# Patient Record
Sex: Female | Born: 1975
Health system: Southern US, Community
[De-identification: ages and names within clinical notes are randomized; demographics above are authoritative.]

## PROBLEM LIST (undated history)

## (undated) DIAGNOSIS — Z973 Presence of spectacles and contact lenses: Secondary | ICD-10-CM

## (undated) DIAGNOSIS — T7840XA Allergy, unspecified, initial encounter: Secondary | ICD-10-CM

## (undated) DIAGNOSIS — D649 Anemia, unspecified: Secondary | ICD-10-CM

## (undated) DIAGNOSIS — I10 Essential (primary) hypertension: Secondary | ICD-10-CM

## (undated) DIAGNOSIS — N939 Abnormal uterine and vaginal bleeding, unspecified: Secondary | ICD-10-CM

## (undated) DIAGNOSIS — Z972 Presence of dental prosthetic device (complete) (partial): Secondary | ICD-10-CM

## (undated) DIAGNOSIS — R87619 Unspecified abnormal cytological findings in specimens from cervix uteri: Secondary | ICD-10-CM

## (undated) DIAGNOSIS — R519 Headache, unspecified: Secondary | ICD-10-CM

## (undated) DIAGNOSIS — N390 Urinary tract infection, site not specified: Secondary | ICD-10-CM

## (undated) DIAGNOSIS — F329 Major depressive disorder, single episode, unspecified: Secondary | ICD-10-CM

## (undated) DIAGNOSIS — O139 Gestational [pregnancy-induced] hypertension without significant proteinuria, unspecified trimester: Secondary | ICD-10-CM

## (undated) DIAGNOSIS — R51 Headache: Secondary | ICD-10-CM

## (undated) DIAGNOSIS — N871 Moderate cervical dysplasia: Secondary | ICD-10-CM

## (undated) DIAGNOSIS — G5603 Carpal tunnel syndrome, bilateral upper limbs: Secondary | ICD-10-CM

## (undated) DIAGNOSIS — B019 Varicella without complication: Secondary | ICD-10-CM

## (undated) DIAGNOSIS — K219 Gastro-esophageal reflux disease without esophagitis: Secondary | ICD-10-CM

## (undated) DIAGNOSIS — F32A Depression, unspecified: Secondary | ICD-10-CM

## (undated) HISTORY — DX: Varicella without complication: B01.9

## (undated) HISTORY — DX: Allergy, unspecified, initial encounter: T78.40XA

## (undated) HISTORY — DX: Anemia, unspecified: D64.9

## (undated) HISTORY — DX: Headache: R51

## (undated) HISTORY — DX: Unspecified abnormal cytological findings in specimens from cervix uteri: R87.619

## (undated) HISTORY — DX: Essential (primary) hypertension: I10

## (undated) HISTORY — DX: Headache, unspecified: R51.9

## (undated) HISTORY — DX: Urinary tract infection, site not specified: N39.0

## (undated) HISTORY — PX: TUBAL LIGATION: SHX77

## (undated) HISTORY — DX: Major depressive disorder, single episode, unspecified: F32.9

## (undated) HISTORY — PX: LEEP: SHX91

## (undated) HISTORY — DX: Depression, unspecified: F32.A

---
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
## 1997-06-02 ENCOUNTER — Ambulatory Visit (HOSPITAL_COMMUNITY): Admission: RE | Admit: 1997-06-02 | Discharge: 1997-06-02 | Payer: Self-pay | Admitting: Obstetrics and Gynecology

## 1997-06-30 ENCOUNTER — Ambulatory Visit (HOSPITAL_COMMUNITY): Admission: RE | Admit: 1997-06-30 | Discharge: 1997-06-30 | Payer: Self-pay | Admitting: Obstetrics and Gynecology

## 1997-07-08 ENCOUNTER — Inpatient Hospital Stay (HOSPITAL_COMMUNITY): Admission: AD | Admit: 1997-07-08 | Discharge: 1997-07-11 | Payer: Self-pay | Admitting: *Deleted

## 1997-08-17 ENCOUNTER — Other Ambulatory Visit: Admission: RE | Admit: 1997-08-17 | Discharge: 1997-08-17 | Payer: Self-pay | Admitting: *Deleted

## 1999-10-26 ENCOUNTER — Other Ambulatory Visit: Admission: RE | Admit: 1999-10-26 | Discharge: 1999-10-26 | Payer: Self-pay | Admitting: Obstetrics and Gynecology

## 2000-11-01 ENCOUNTER — Other Ambulatory Visit: Admission: RE | Admit: 2000-11-01 | Discharge: 2000-11-01 | Payer: Self-pay | Admitting: Obstetrics and Gynecology

## 2001-11-28 ENCOUNTER — Other Ambulatory Visit: Admission: RE | Admit: 2001-11-28 | Discharge: 2001-11-28 | Payer: Self-pay | Admitting: Obstetrics and Gynecology

## 2002-04-02 HISTORY — PX: TUBAL LIGATION: SHX77

## 2002-10-30 ENCOUNTER — Inpatient Hospital Stay (HOSPITAL_COMMUNITY): Admission: AD | Admit: 2002-10-30 | Discharge: 2002-10-30 | Payer: Self-pay | Admitting: Obstetrics and Gynecology

## 2002-11-08 ENCOUNTER — Inpatient Hospital Stay (HOSPITAL_COMMUNITY): Admission: AD | Admit: 2002-11-08 | Discharge: 2002-11-12 | Payer: Self-pay | Admitting: *Deleted

## 2002-11-09 ENCOUNTER — Encounter (INDEPENDENT_AMBULATORY_CARE_PROVIDER_SITE_OTHER): Payer: Self-pay

## 2002-11-09 ENCOUNTER — Encounter: Payer: Self-pay | Admitting: *Deleted

## 2002-12-15 ENCOUNTER — Other Ambulatory Visit: Admission: RE | Admit: 2002-12-15 | Discharge: 2002-12-15 | Payer: Self-pay | Admitting: Obstetrics and Gynecology

## 2008-03-22 ENCOUNTER — Other Ambulatory Visit: Admission: RE | Admit: 2008-03-22 | Discharge: 2008-03-22 | Payer: Self-pay | Admitting: Obstetrics and Gynecology

## 2010-06-07 ENCOUNTER — Emergency Department (HOSPITAL_COMMUNITY)
Admission: EM | Admit: 2010-06-07 | Discharge: 2010-06-07 | Disposition: A | Discharge status: Home / Self Care | Payer: No Typology Code available for payment source | Attending: Emergency Medicine | Admitting: Emergency Medicine

## 2010-06-07 DIAGNOSIS — Y92410 Unspecified street and highway as the place of occurrence of the external cause: Secondary | ICD-10-CM | POA: Insufficient documentation

## 2010-06-07 DIAGNOSIS — S40029A Contusion of unspecified upper arm, initial encounter: Secondary | ICD-10-CM | POA: Insufficient documentation

## 2010-06-07 DIAGNOSIS — S139XXA Sprain of joints and ligaments of unspecified parts of neck, initial encounter: Secondary | ICD-10-CM | POA: Insufficient documentation

## 2010-08-18 NOTE — H&P (Signed)
NAME:  Candice Arellano, Candice Arellano                       ACCOUNT NO.:  [id number]   MEDICAL RECORD NO.:  [id number]                   PATIENT TYPE:  INP   LOCATION:  9168                                 FACILITY:  [facility]   PHYSICIAN:  Maxie Better, M.D.            DATE OF BIRTH:  05-Dec-1975   DATE OF ADMISSION:  11/08/2002  DATE OF DISCHARGE:                                HISTORY & PHYSICAL   REASON FOR ADMISSION:  Induction of labor secondary to polyhydramnios.   HISTORY OF PRESENT ILLNESS:  The patient is a 35 year old Gravida III, Para  1-1-0-1 married black female at 74 plus weeks gestation with polyhydramnios  admitted for induction of labor.   PAST OBSTETRICAL HISTORY:  Notable for a stillbirth at 55 weeks in February  1996. The patient has been monitored closely with a non-stress test due to  the polyhydramnios.  The polyhydramnios was diagnosed on June 18 at 32 plus  weeks at which time the patient was sized greater than dates.  Her one hour  glucose challenge test was normal.  Her NST's have been done weekly due to  the history of stillbirth which have been reassuring.  The patient has had  irregular contractions with fetal movement.  Group B Strep culture was  negative.  She has intact membranes.  Her prenatal care has been at [facility], obstetrician Maxie Better, M.D.   PRENATAL LABS:  Blood type is B positive.  Antibody screen is negative.  RPR  is non-reactive.  Rubella is immune.  Hepatitis B surface antigen is  negative.  HIV test was negative.  GC and Chlamydia cultures were negative.  Hemoglobin electrophoresis has an abnormal variant.  Pap test was normal.  AFB3 test was normal.  Anatomic fetal survey was done at 20 weeks on March  23 and showed complete placenta previa which was subsequently resolved by  ultrasound at 23.5 weeks.  Last ultrasound on October 20, 2002 at 36.5 weeks  showed polyhydramnios still present with an amniotic A index of 25.1,  estimated fetal weight was 2,970 grams which was in the 61st percentile.   ALLERGIES:  No known drug allergies.   MEDICATIONS:  1. Prenatal vitamins.  2. Iron.   PAST MEDICAL HISTORY:  Anemia.   PAST SURGICAL HISTORY:  Left thumb laceration repaired.   OBSTETRICAL HISTORY:  1. February 1996 - 32 weeks vaginal delivery, stillbirth, cord accident.  2. April 1999 - 8 pound 5 ounce baby at 39 weeks, eleven hours of labor.   FAMILY HISTORY:  Noncontributory.   SOCIAL HISTORY:  She is married.  She is a non-smoker.  She works with [employer].  She has one child.   REVIEW OF SYMPTOMS:  Negative.   PHYSICAL EXAMINATION:  GENERAL:  Well developed, well nourished gravid  female in no acute distress.  VITAL SIGNS:  Blood pressure is 125/77 and she is afebrile.  SKIN:  No lesions.  HEENT: Normocephalic, atraumatic. Conjunctivae negative.  Oropharynx  negative.  HEART:  Regular rate and rhythm without murmur.  LUNGS:  Clear to auscultation.  BREASTS:  Soft and non-tender with no palpable masses.  ABDOMEN:  Gravid, fundal height 43 cm.  PELVIC:  Cervix is 1, 50% and -3, vertex.  EXTREMITIES:  1+ edema.   IMPRESSION:  Polyhydramnios, term gestation, desires permanent  sterilization, history of stillbirth.   PLAN:  Admission, low dose Pitocin on November 09, 2002, Cervidil on November 08, 2002 for cervical ripening, analgesics, amniotomy p.r.n., postpartum tubal  ligation versus intimal tubal ligation depending on mode of delivery.  The  permanence of the sterilization has been reviewed with the patient.  Alternative birth control methods have been declined.  Failure rate of 1 out  of 300 discussed.  Post tubal ligation syndrome also discussed.                                               Maxie Better, M.D.    [place]/MEDQ  D:  11/09/2002  T:  11/09/2002  Job:  [id number]

## 2010-08-18 NOTE — Discharge Summary (Signed)
Candice Arellano, RALLIS                       ACCOUNT NO.:  [id number]   MEDICAL RECORD NO.:  [id number]                   PATIENT TYPE:  INP   LOCATION:  9121                                 [facility]ILITY:  [facility]   PHYSICIAN:  Maxie Better, M.D.            DATE OF BIRTH:  10/18/1975   DATE OF ADMISSION:  11/08/2002  DATE OF DISCHARGE:  11/12/2002                                 DISCHARGE SUMMARY   ADMISSION DIAGNOSES:  1. Polyhydramnios term gestation.  2. Previous history of a stillbirth.  3. Desires permanent sterilization.   DISCHARGE DIAGNOSES:  1. Term gestation, delivered.  2. Desires sterilization.  3. Nonreassuring fetal status.  4. Placental abruption.   PROCEDURE:  1. Primary cesarean section.  2. Modified Pomeroy tubal ligation.   HISTORY OF PRESENT ILLNESS:  A 35 year old gravida 3, para 1-1-0-1 female  with polyhydramnios at term admitted for induction of labor.  The patient  history is notable for a stillbirth at 32 weeks for which she underwent  antepartum surveillance during this pregnancy.   HOSPITAL COURSE:  The patient was admitted.  She was started on low dose  Pitocin due to the inability to utilize Cervidil.  The patient underwent  artificial rupture of membranes.  Clear fluid noted at the time.  She  progressed to 8 cm and arrested at that dilatation despite adequate  [place] units.  The patient received an epidural.  Moderate variable  decelerations were subsequently noted with late component confirmed by  internal fetal scalp electrode.  This persisted despite discontinuation of  her Pitocin and maternal oxygenation and positional change.  With her  examination remaining unchanged, bloody fluid subsequently noted from the  vagina, decision was made to proceed with a primary urgent cesarean section.  The patient also confirmed that she does still desire sterilization.  The  patient was taken to the operating room where she underwent primary  cesarean  section with resultant delivery of a live female, Apgars of 9 and 9.  Clinical evaluation of the placenta with removal was that of placental  abruption.  Placenta was therefore sent to pathology.  Weight of the baby  was 7 pounds 9 ounces.  The patient underwent tubal ligation at that time.  Normal ovaries were noted bilaterally.  The patient had an unremarkable  postoperative course.  Her hemoglobin on postoperative day #1 showed a  hemoglobin of 8.5, white count 14.2, hematocrit 24.4.  Her pre admission  hemoglobin was 10.8.  By postoperative day #3 the patient was tolerating a  regular diet.  She was afebrile.  No evidence of infection.  Was deemed well  to be discharged home.   DISPOSITION:  Home.   CONDITION ON DISCHARGE:  Stable.   DISCHARGE MEDICATIONS:  1. Ferrous sulfate 325 mg one p.o. b.i.d.  2. Prenatal vitamins one p.o. daily.  3. Motrin 600 mg p.o. q.6h.  4. Tylox one p.o. q.6h. p.r.n. pain #30.  DISCHARGE INSTRUCTIONS:  Postpartum booklet given and reviewed.  Follow-up  appointment is [facility] in four to six weeks.                                               Maxie Better, M.D.    /MEDQ  D:  11/27/2002  T:  11/27/2002  Job:  [id number]

## 2010-08-18 NOTE — Op Note (Signed)
NAME:  Candice Arellano, Candice Arellano                       ACCOUNT NO.:  [id number]   MEDICAL RECORD NO.:  [id number]                   PATIENT TYPE:  INP   LOCATION:  9121                                 [facility]   PHYSICIAN:  Maxie Better, M.D.            DATE OF BIRTH:  September 14, 1975   DATE OF PROCEDURE:  11/09/2002  DATE OF DISCHARGE:                                 OPERATIVE REPORT   PREOPERATIVE DIAGNOSES:  Nonreassuring fetal status.  Desires sterilization.   POSTOPERATIVE DIAGNOSES:  Nonreassuring fetal status.  Desires  sterilization.  Placental abruption.   PROCEDURE:  Primary cesarean section, modified Pomeroy tubal ligation.   ANESTHESIA:  Epidural.   SURGEON:  Maxie Better, M.D.   INDICATIONS:  This is a 35 year old gravida 3, para 1-1-0-1 female at term  admitted for induction of labor secondary to polyhydramnios.  Past  obstetrical history is notable for a stillbirth at 32 weeks.  The patient  was admitted on 11/08/2002.  She was not a candidate for Cervidil due to  increased contractions at the time that she presented.  She was started on  low dose Pitocin which was subsequently increased on 11/09/2002.  The patient  had artificial rupture of membranes, clear amniotic fluid.  She progressed  to about 8 cm and arrested at that dilatation despite documented adequate  intrauterine pressure monitoring.  Internal fetal scalp electrode was  subsequently placed.  During the course of her labor, she received an  epidural for pain management at around 8 cm, especially when no cervical  change had been noted and continued pain not fully relieved by analgesics.  The patient subsequently began having moderate variable decelerations with  late component by internal fetal scalp electrode.  This persisted despite  discontinuation of the Pitocin, maternal oxygenation.  On examination, the  patient remained 8 cm, 0 station, with bloody fluid noted.  Fetal heart rate  subsequently decreased to the 50s, not responsive to maternal positional  changes and maternal oxygenation.  The decision was then made to proceed  with urgent cesarean section.  The patient had expressed a desire for  sterilization, and this was reiterated again prior to transfer to the  operating room for her cesarean section.  The procedure and risks were  reviewed with the patient and her husband.  She was transferred to the  operating room.  Amnioinfusion had been started at the time when the  decelerations were noted, and this was discontinued.   PROCEDURE:  Under adequate epidural anesthesia, the patient was quickly  prepped and draped in the usual sterile fashion.  The internal scalp  electrode was removed.  Intraoperatively her fetal heart rate in the 160s to  150s.  An indwelling Foley catheter was placed in the operating room.  A  Pfannenstiel skin incision was then made, carried down to the rectus fascia  using Bovie cautery.  The rectus fascia was incised in the midline, extended  transversely.  The rectus fascia was then bluntly and sharply dissected off  the rectus muscle in a superior and inferior fashion.  The rectus muscle was  separated in the midline.  The parietal peritoneum was bluntly entered.  The  vesicouterine peritoneum was opened and extended transversely.  The bladder  was bluntly dissected off the lower segment.  A curvilinear low transverse  uterine incision was then made and extended bilaterally using bandage  stitches, at which point clotted blood was noted.  Subsequent delivery of a  live female from the left occiput transverse position with a cord around the  neck was noted.  The baby was bulb suctioned on the abdomen.  The cord was  clamped, cut.  The baby was transferred to the awaiting pediatricians with  Apgars of 9 and 9 at one and five minutes.  The placenta was intact and sent  to pathology for the findings of additional clots that had been noted,   consistent with a placental abruption.  The uterine cavity was then cleaned  of debris and clotty material.   The uterine incision was then closed in two layers, the first layer with 0  Monocryl in running, locked stitch; the second layer was imbricated with 0  Monocryl.  Additional figure-of-eight suture was placed for hemostasis on  the right middle aspect of the incision.  Small bleeding along the inferior  aspect of the uterus was cauterized.  Normal tubes and ovaries were noted  bilaterally.  The left fallopian tube was brought up into the field.  The  mid portion of the left fallopian tube was grasped with a Babcock.  The  underlying mesosalpinx was opened with the cautery and 0 chromic suture x 2  proximally and distally were then done and the intervening segment of tube  was removed.  The same procedure was performed on the contralateral right  fallopian tube after identifying its fimbriated end.  The abdomen was  copiously irrigated, suctioned of debris.  Inspection of the incision showed  good hemostasis.  The parietal and the vesicouterine peritoneum were not  closed.  The rectus fascia was inspected.  The rectus muscle was inspected.  Small bleeders were cauterized.  The rectus fascia was closed with 0 Vicryl  x 2.  The subcutaneous area was irrigated, suctioned of debris.  Small  bleeders were cauterized.  The skin was approximated using Ethicon staples.  The specimen was portions of the right and left fallopian tubes and  placenta, all sent to pathology.  Estimated blood loss was 800 cc.  Intraoperative fluid was 1600 cc Crystalloid.  Urine output was 500 cc  urine.  X-ray was done for lack of a count at the onset of the case due to  the urgency, and this was negative for any retained instruments or sponges.   COMPLICATIONS:  None.   The baby's weight was 7 pounds 9 ounces.  The patient tolerated the  procedure well and was transferred to the recovery room in stable  condition.                                              Maxie Better, M.D.    [place]/MEDQ  D:  11/09/2002  T:  11/10/2002  Job:  [id number]

## 2010-08-18 NOTE — H&P (Signed)
   NAME:  Candice Arellano, Candice Arellano                       ACCOUNT NO.:  [id number]   MEDICAL RECORD NO.:  [id number]                   PATIENT TYPE:  MAT   LOCATION:  MATC                                 [facility]  [facility]   PHYSICIAN:  Lenoard Aden, M.D.             DATE OF BIRTH:  1975/08/18   DATE OF ADMISSION:  10/30/2002  DATE OF DISCHARGE:                                HISTORY & PHYSICAL   CHIEF COMPLAINT:  Rule out preeclampsia with elevated blood pressures in the  office.   HISTORY OF PRESENT ILLNESS:  The patient is a 35 year old African-American  female G3, P2, EDD of November 13, 2002 at 38 weeks with increased blood  pressure in the office today.   PAST MEDICAL HISTORY:  SVD in 1996 and in 1999 a 39-week SVD stillbirth but  in 1996 she has one living child.  Past medical history otherwise  contributory for thumb surgery.   FAMILY HISTORY:  CP, heart murmur, hypertension, diabetes.   PRENATAL LABORATORY COURSE:  B positive, rubella immune, hepatitis/HIV  negative.   PHYSICAL EXAMINATION:  GENERAL:  Well-developed, well-nourished African-  American female.  VITAL SIGNS:  Blood pressure 73-116/53-70.  HEENT:  Normal.  LUNGS:  Clear.  HEART:  Regular rhythm.  ABDOMEN:  Soft, gravid, nontender.  CERVICAL:  Exam deferred.  EXTREMITIES:  DTRs 1+, no evidence of clonus, 1+ edema pretibially noted.   LABORATORY DATA:  CBC is within normal limits; platelet count of 262.  Uric  acid 3.9.  LDH of 118.  SGOT 24, SGPT is upper normal at 41.  NST is  reactive, irregular contractions noted.   IMPRESSION:  1. Thirty-eight-week intrauterine pregnancy.  2. Elevated blood pressures with stabilization on bed rest, normal     laboratories.   PLAN:  Discharge home.  Preeclamptic warnings given.  Follow up in the  office 3 days for blood pressure check and repeat LFTs.                                               Lenoard Aden, M.D.    [facility]/MEDQ  D:  10/30/2002  T:  10/30/2002   Job:  [id number]   cc:   [facility]

## 2010-10-18 ENCOUNTER — Encounter: Payer: Self-pay | Admitting: Family Medicine

## 2010-10-18 ENCOUNTER — Ambulatory Visit (INDEPENDENT_AMBULATORY_CARE_PROVIDER_SITE_OTHER): Payer: No Typology Code available for payment source | Admitting: Family Medicine

## 2010-10-18 ENCOUNTER — Other Ambulatory Visit: Payer: Self-pay | Admitting: Family Medicine

## 2010-10-18 VITALS — BP 127/79 | HR 81 | Temp 98.4°F | Ht 63.0 in | Wt 236.0 lb

## 2010-10-18 DIAGNOSIS — E669 Obesity, unspecified: Secondary | ICD-10-CM

## 2010-10-18 DIAGNOSIS — D649 Anemia, unspecified: Secondary | ICD-10-CM

## 2010-10-18 DIAGNOSIS — N920 Excessive and frequent menstruation with regular cycle: Secondary | ICD-10-CM

## 2010-10-18 MED ORDER — NORGESTIMATE-ETH ESTRADIOL 0.25-35 MG-MCG PO TABS: 1.0000 | ORAL_TABLET | Freq: Every day | ORAL | Status: DC

## 2010-10-18 NOTE — Assessment & Plan Note (Signed)
History due to menorrhagia.  Will check CBC today.

## 2010-10-18 NOTE — Progress Notes (Signed)
  Subjective:    Patient ID: Candice Arellano, female    DOB: [birth year], 35 y.o.   MRN: [id number]  HPI Here to establish primary care.  Has always had heavy periods for 5-7 days, but in the past year has had a week of spotting preceding her week of menses.   Notes irregularity but does not skip a month.  Has not been treated for this before, but used be be on OCP's and depo before BTL in 2004.  OCp's helped menstural flow.    Notes lot of cramping with periods, has to change pads 6-8 times per day, with quarter sized clots. No vaginal discharge or pain.  No history of STD's. But has had frequent yeast infections and had a UTI with abx use.   Review of Systems  Constitutional: Negative for fever, chills and unexpected weight change.  HENT: Negative for hearing loss, sore throat and trouble swallowing.   Eyes: Negative for discharge and visual disturbance.  Respiratory: Negative for cough and shortness of breath.   Cardiovascular: Negative for chest pain and palpitations.  Gastrointestinal: Negative for nausea, vomiting, abdominal pain, diarrhea and constipation.  Genitourinary: Positive for vaginal bleeding. Negative for dysuria, hematuria, vaginal discharge and vaginal pain.  Musculoskeletal: Negative for back pain and arthralgias.  Neurological: Positive for dizziness. Negative for weakness and light-headedness.  Psychiatric/Behavioral: Negative for sleep disturbance. The patient is not nervous/anxious.        Objective:   Physical Exam GEN: Alert & Oriented, No acute distress HEENT: [place]/AT. EOMI, PERRLA, no conjunctival injection or scleral icterus.  Bilateral tympanic membranes intact without erythema or effusion.  .  Nares without edema or rhinorrhea.  Oropharynx is without erythema or exudates.  No anterior or posterior cervical lymphadenopathy. CV:  Regular Rate & Rhythm, no murmur Respiratory:  Normal work of breathing, CTAB Abd:  + BS, soft, no tenderness to palpation Ext:  no pre-tibial edema        Assessment & Plan:

## 2010-10-18 NOTE — Patient Instructions (Signed)
Schedule follow-up wellness physical and pap Will talk more about healthy lifestyle and nutrition/exercise

## 2010-10-18 NOTE — Assessment & Plan Note (Signed)
Menorrhagia since adolescence, more irregular in past 12 months.  Discussed options to include nsaids, hormonal contraception, IUD.  She feels OCP will be most cost effective and has worked for her previously.  Will check CBC, TSH.  Patient to return for gynecological exam/ pap, cultures.

## 2010-10-18 NOTE — Assessment & Plan Note (Signed)
Will check fasting labs in preparation for physical.

## 2010-10-19 LAB — CBC
HCT: 30.9 % — ABNORMAL LOW (ref 36.0–46.0)
MCH: 20.8 pg — ABNORMAL LOW (ref 26.0–34.0)
MCHC: 29.4 g/dL — ABNORMAL LOW (ref 30.0–36.0)
MCV: 70.7 fL — ABNORMAL LOW (ref 78.0–100.0)
Platelets: 349 10*3/uL (ref 150–400)

## 2010-10-19 LAB — COMPREHENSIVE METABOLIC PANEL
AST: 15 U/L (ref 0–37)
Albumin: 3.9 g/dL (ref 3.5–5.2)
Calcium: 8.8 mg/dL (ref 8.4–10.5)
Chloride: 104 mEq/L (ref 96–112)
Creat: 0.67 mg/dL (ref 0.50–1.10)
Glucose, Bld: 86 mg/dL (ref 70–99)
Potassium: 4.1 mEq/L (ref 3.5–5.3)
Sodium: 139 mEq/L (ref 135–145)
Total Bilirubin: 0.2 mg/dL — ABNORMAL LOW (ref 0.3–1.2)
Total Protein: 7.9 g/dL (ref 6.0–8.3)

## 2010-10-19 LAB — LIPID PANEL
Cholesterol: 141 mg/dL (ref 0–200)
HDL: 45 mg/dL (ref >39)
LDL Cholesterol: 83 mg/dL (ref 0–99)
Triglycerides: 64 mg/dL (ref <150)

## 2010-10-20 ENCOUNTER — Encounter: Payer: Self-pay | Admitting: Family Medicine

## 2010-11-13 ENCOUNTER — Telehealth: Payer: Self-pay | Admitting: Family Medicine

## 2010-11-13 NOTE — Telephone Encounter (Signed)
Spoke with patient.   Had normal menses end of July which was about 1 week early, then started OCP that Sunday.  Has bled daily since then, now just spotting.  Is taking iron tablets, no dyspnea, fatigue, lightheadedness.  Has not missed any pills.  Advised to continue OCP for 3 months, given red flags to return earlier.  Reminded her she is due for pelvic exam.

## 2010-11-13 NOTE — Telephone Encounter (Signed)
Was prescribed Birth Control pills but now her period won't stop.  She doesn't know what she should do, should she stop the Birth Control or is there something that can be prescribed to stop her period.  Is going on the 3rd week of her cycle.

## 2011-01-12 ENCOUNTER — Encounter: Payer: Self-pay | Admitting: Family Medicine

## 2011-01-12 ENCOUNTER — Other Ambulatory Visit (HOSPITAL_COMMUNITY)
Admission: RE | Admit: 2011-01-12 | Discharge: 2011-01-12 | Disposition: A | Discharge status: Home / Self Care | Payer: Medicaid Other | Source: Ambulatory Visit | Attending: Family Medicine | Admitting: Family Medicine

## 2011-01-12 ENCOUNTER — Ambulatory Visit (INDEPENDENT_AMBULATORY_CARE_PROVIDER_SITE_OTHER): Payer: Medicaid Other | Admitting: Family Medicine

## 2011-01-12 ENCOUNTER — Telehealth: Payer: Self-pay | Admitting: Family Medicine

## 2011-01-12 VITALS — BP 138/88 | HR 87 | Temp 98.3°F | Ht 63.0 in | Wt 241.0 lb

## 2011-01-12 DIAGNOSIS — D649 Anemia, unspecified: Secondary | ICD-10-CM

## 2011-01-12 DIAGNOSIS — Z124 Encounter for screening for malignant neoplasm of cervix: Secondary | ICD-10-CM

## 2011-01-12 DIAGNOSIS — Z23 Encounter for immunization: Secondary | ICD-10-CM

## 2011-01-12 DIAGNOSIS — R300 Dysuria: Secondary | ICD-10-CM

## 2011-01-12 DIAGNOSIS — Z01419 Encounter for gynecological examination (general) (routine) without abnormal findings: Secondary | ICD-10-CM

## 2011-01-12 DIAGNOSIS — Z202 Contact with and (suspected) exposure to infections with a predominantly sexual mode of transmission: Secondary | ICD-10-CM

## 2011-01-12 DIAGNOSIS — Z20828 Contact with and (suspected) exposure to other viral communicable diseases: Secondary | ICD-10-CM

## 2011-01-12 DIAGNOSIS — A599 Trichomoniasis, unspecified: Secondary | ICD-10-CM | POA: Insufficient documentation

## 2011-01-12 LAB — POCT URINALYSIS DIPSTICK
Ketones, UA: NEGATIVE
Spec Grav, UA: >=1.03
Urobilinogen, UA: 1

## 2011-01-12 LAB — POCT UA - MICROSCOPIC ONLY: Epithelial cells, urine per micros: >20

## 2011-01-12 LAB — POCT WET PREP (WET MOUNT): WBC, Wet Prep HPF POC: >20

## 2011-01-12 MED ORDER — METRONIDAZOLE 500 MG PO TABS: 500.0000 mg | ORAL_TABLET | Freq: Two times a day (BID) | ORAL | Status: AC

## 2011-01-12 MED ORDER — CEPHALEXIN 500 MG PO CAPS: 500.0000 mg | ORAL_CAPSULE | Freq: Two times a day (BID) | ORAL | Status: AC

## 2011-01-12 MED ORDER — FLUCONAZOLE 150 MG PO TABS: 150.0000 mg | ORAL_TABLET | Freq: Once | ORAL | Status: AC

## 2011-01-12 NOTE — Assessment & Plan Note (Signed)
Due to menorrhagia. I expect this to be improved as patient has recently started oral contraceptives and notes a much improved bleeding pattern. I have advised her to continue daily iron supplements which she has been using intermittently. I will give further recommendations upon finding out her hemoglobin level today.

## 2011-01-12 NOTE — Assessment & Plan Note (Signed)
metronidazole + fluconazole for yeast.  Advised protected sex, alerting partners.

## 2011-01-12 NOTE — Telephone Encounter (Signed)
Spoke with patient on wet prep results

## 2011-01-12 NOTE — Progress Notes (Signed)
  Subjective:    Patient ID: Candice Arellano, female    DOB: 09/18/1975, 35 y.o.   MRN: [id number]  HPI Annual Gynecological Exam  G2P2 Wt Readings from Last 3 Encounters:  01/12/11 241 lb (109.317 kg)  10/18/10 236 lb (107.049 kg)   Last period: 12/22/2010 Regular periods: yes Heavy bleeding: no- did have heavy bleeding, now improved with birth control  Sexually active: yes Birth control or hormonal therapy:oocp Hx of STD: Patient desires STD screening Dyspareunia: No Hot flashes: No Vaginal discharge: thin, increased since OCP Dysuria: yes with frequency, no blood or fever.  Some back pain.  Last mammogram: n/a Breast mass or concerns: No Last Pap: several years ago History of abnormal pap: last one normal.  FH of breast, uterine, ovarian, colon cancer: No    Review of Systems    see hpi above. Objective:   Physical Exam GEN: Alert & Oriented, No acute distress Breast: no masses, skin changes, nipple discharge or LAD. CV:  Regular Rate & Rhythm, no murmur Respiratory:  Normal work of breathing, CTAB Abd:  + BS, soft, no tenderness to palpation Ext: no pre-tibial edema Pelvic Exam:        External: normal female genitalia without lesions or masses        Vagina: normal without lesions or masses        Cervix: normal without lesions or masses        Adnexa: normal bimanual exam without masses or fullness        Uterus: normal by palpation        Pap smear: performed        Samples for Wet prep, GC/Chlamydia obtained        Assessment & Plan:

## 2011-01-13 LAB — CBC
HCT: 27.7 % — ABNORMAL LOW (ref 36.0–46.0)
MCH: 20.1 pg — ABNORMAL LOW (ref 26.0–34.0)
MCV: 67.9 fL — ABNORMAL LOW (ref 78.0–100.0)
Platelets: 385 10*3/uL (ref 150–400)
RBC: 4.08 MIL/uL (ref 3.87–5.11)
WBC: 8.4 10*3/uL (ref 4.0–10.5)

## 2011-01-15 ENCOUNTER — Telehealth: Payer: Self-pay | Admitting: Family Medicine

## 2011-01-15 DIAGNOSIS — D649 Anemia, unspecified: Secondary | ICD-10-CM

## 2011-01-15 NOTE — Assessment & Plan Note (Signed)
Advised taking iron supplements BID and will recheck hemoglobin in 3-4 weeks.    She is asymptomatic.

## 2011-01-15 NOTE — Telephone Encounter (Signed)
Will take iron tablets for 3-4 weeks, will come back and draw poct-hgb.  If below 11.0, will draw iron studies- see orders.

## 2011-01-17 ENCOUNTER — Telehealth: Payer: Self-pay | Admitting: Family Medicine

## 2011-01-18 ENCOUNTER — Telehealth: Payer: Self-pay | Admitting: Family Medicine

## 2011-01-18 NOTE — Telephone Encounter (Signed)
Spoke with patient about abnormal paps. She will call right now to schedule colposcopy for LGSIL.

## 2011-01-18 NOTE — Telephone Encounter (Signed)
UNABLE TO REACH PATIENT WILL CALL AGAIN TOMORROW

## 2011-01-25 ENCOUNTER — Ambulatory Visit (INDEPENDENT_AMBULATORY_CARE_PROVIDER_SITE_OTHER): Payer: Medicaid Other | Admitting: Family Medicine

## 2011-01-25 VITALS — BP 124/78 | HR 86 | Temp 98.3°F | Ht 63.0 in | Wt 236.0 lb

## 2011-01-25 DIAGNOSIS — N920 Excessive and frequent menstruation with regular cycle: Secondary | ICD-10-CM

## 2011-01-25 DIAGNOSIS — R87619 Unspecified abnormal cytological findings in specimens from cervix uteri: Secondary | ICD-10-CM

## 2011-01-25 LAB — POCT URINE PREGNANCY: Preg Test, Ur: NEGATIVE

## 2011-01-26 NOTE — Progress Notes (Signed)
  Subjective:    Patient ID: Candice Arellano, female    DOB: 1975-08-18, 35 y.o.   MRN: [id number]  HPI Abnormal pap No vaginal pain or abnormal discharge   Review of Systems Pertinent review of systems: negative for fever or unusual weight change.     Objective:   Physical Exam  GU externally normal. Patient given informed consent, signed copy in the chart.  Placed in lithotomy position. Cervix viewed with speculum and colposcope after application of acetic acid.   Colposcopy adequate (entire squamocolumnar junctions seen  in entirety) ?  yes Acetowhite lesions?no Punctation?no Mosaicism?  no Abnormal vasculature?  no Biopsies?no  ECC?no Complications? no  COMMENTS:Clinically normal colposcopy Patient was given post procedure instructions.  I will notify her of any pathology results.       Assessment & Plan:  LGSIL on pap with clinically normal colposcopy Pap in one year

## 2011-01-30 ENCOUNTER — Ambulatory Visit (INDEPENDENT_AMBULATORY_CARE_PROVIDER_SITE_OTHER): Payer: Medicaid Other | Admitting: Family Medicine

## 2011-01-30 VITALS — BP 141/92 | HR 88 | Temp 98.0°F | Ht 63.0 in | Wt 235.0 lb

## 2011-01-30 DIAGNOSIS — R300 Dysuria: Secondary | ICD-10-CM

## 2011-01-30 DIAGNOSIS — D649 Anemia, unspecified: Secondary | ICD-10-CM

## 2011-01-30 LAB — POCT URINALYSIS DIPSTICK
Bilirubin, UA: NEGATIVE
Nitrite, UA: NEGATIVE
pH, UA: 5.5

## 2011-01-30 LAB — POCT UA - MICROSCOPIC ONLY

## 2011-01-30 MED ORDER — CEPHALEXIN 500 MG PO CAPS: 500.0000 mg | ORAL_CAPSULE | Freq: Two times a day (BID) | ORAL | Status: AC

## 2011-01-30 NOTE — Assessment & Plan Note (Signed)
Patient is feeling well with only minor fatigue. She reports taking her iron pills twice daily with no problems. She will return in several weeks to recheck her hemoglobin

## 2011-01-30 NOTE — Progress Notes (Signed)
Addended by: [place], Tangy Drozdowski on: 01/30/2011 12:28 PM   Modules accepted: Orders

## 2011-01-30 NOTE — Assessment & Plan Note (Signed)
Urinalysis today is suggestive of repeat infection. Will also culture this. We'll place her on Keflex empirically until culture returns. She has had resolution of her vaginal symptoms and I do not think this is leading to her urinary frequency. I advised her to return if no resolution of symptoms and will consider cervical cultures at that time

## 2011-01-30 NOTE — Patient Instructions (Addendum)
Make a lab appointment for two weeks for a lab draw-  Please bring this sheet with you to remind the lab that If your POCT hemoglobin is below 11, to draw rest of anemia panel   Urinary Tract Infection Infections of the urinary tract can start in several places. A bladder infection (cystitis), a kidney infection (pyelonephritis), and a prostate infection (prostatitis) are different types of urinary tract infections (UTIs). They usually get better if treated with medicines (antibiotics) that kill germs. Take all the medicine until it is gone. You or your child may feel better in a few days, but TAKE ALL MEDICINE or the infection may not respond and may become more difficult to treat. HOME CARE INSTRUCTIONS    Drink enough water and fluids to keep the urine clear or pale yellow. Cranberry juice is especially recommended, in addition to large amounts of water.     Avoid caffeine, tea, and carbonated beverages. They tend to irritate the bladder.     Alcohol may irritate the prostate.     Only take over-the-counter or prescription medicines for pain, discomfort, or fever as directed by your caregiver.  To prevent further infections:  Empty the bladder often. Avoid holding urine for long periods of time.     After a bowel movement, women should cleanse from front to back. Use each tissue only once.     Empty the bladder before and after sexual intercourse.  FINDING OUT THE RESULTS OF YOUR TEST Not all test results are available during your visit. If your or your child's test results are not back during the visit, make an appointment with your caregiver to find out the results. Do not assume everything is normal if you have not heard from your caregiver or the medical facility. It is important for you to follow up on all test results. SEEK MEDICAL CARE IF:    There is back pain.     Your baby is older than 3 months with a rectal temperature of 100.5 F (38.1 C) or higher for more than 1 day.       Your or your child's problems (symptoms) are no better in 3 days. Return sooner if you or your child is getting worse.  SEEK IMMEDIATE MEDICAL CARE IF:    There is severe back pain or lower abdominal pain.     You or your child develops chills.     You have a fever.     Your baby is older than 3 months with a rectal temperature of 102 F (38.9 C) or higher.     Your baby is 73 months old or younger with a rectal temperature of 100.4 F (38 C) or higher.     There is nausea or vomiting.     There is continued burning or discomfort with urination.  MAKE SURE YOU:    Understand these instructions.     Will watch your condition.     Will get help right away if you are not doing well or get worse.  Document Released: 12/27/2004 Document Revised: 11/29/2010 Document Reviewed: 08/01/2006 [facility] Patient Information 2012 [place], [place].

## 2011-01-30 NOTE — Progress Notes (Signed)
  Subjective:    Patient ID: Candice Arellano, female    DOB: 10/08/1975, 35 y.o.   MRN: [id number]  HPI patient presents for a same day appointment for 4 days history of urinary frequency and flank pain  Approximately 2 weeks ago she was diagnosed and treated for a Proteus UTI and had resolution of her symptoms. She was also treated for bacterial vaginosis, yeast Candida, Trichomonas. Both her and her partner have completed treatment and she reports resolution of vaginal discharge and discomfort.  She thinks she may have noticed some hematuria. No fevers chills nausea vomiting or abdominal pain. Some mild pelvic pressure. Review of Systems Please see history of present illness    Objective:   Physical Exam GEN: Alert & Oriented, No acute distress CV:  Regular Rate & Rhythm, no murmur Respiratory:  Normal work of breathing, CTAB Abd:  + BS, soft, no tenderness to palpation, no flank pain Ext: no pre-tibial edema      Assessment & Plan:

## 2011-02-01 LAB — URINE CULTURE: Colony Count: 3000

## 2011-04-03 DIAGNOSIS — R87619 Unspecified abnormal cytological findings in specimens from cervix uteri: Secondary | ICD-10-CM

## 2011-04-03 HISTORY — DX: Unspecified abnormal cytological findings in specimens from cervix uteri: R87.619

## 2011-05-11 ENCOUNTER — Ambulatory Visit: Payer: Medicaid Other | Admitting: Family Medicine

## 2011-05-11 ENCOUNTER — Ambulatory Visit (INDEPENDENT_AMBULATORY_CARE_PROVIDER_SITE_OTHER): Payer: Self-pay | Admitting: Family Medicine

## 2011-05-11 ENCOUNTER — Other Ambulatory Visit (HOSPITAL_COMMUNITY)
Admission: RE | Admit: 2011-05-11 | Discharge: 2011-05-11 | Disposition: A | Discharge status: Home / Self Care | Payer: Self-pay | Source: Ambulatory Visit | Attending: Family Medicine | Admitting: Family Medicine

## 2011-05-11 ENCOUNTER — Encounter: Payer: Self-pay | Admitting: Family Medicine

## 2011-05-11 VITALS — BP 123/85 | HR 76 | Temp 98.0°F | Ht 63.0 in | Wt 227.0 lb

## 2011-05-11 DIAGNOSIS — Z113 Encounter for screening for infections with a predominantly sexual mode of transmission: Secondary | ICD-10-CM | POA: Insufficient documentation

## 2011-05-11 DIAGNOSIS — N898 Other specified noninflammatory disorders of vagina: Secondary | ICD-10-CM

## 2011-05-11 LAB — RPR

## 2011-05-11 LAB — POCT WET PREP (WET MOUNT): Clue Cells Wet Prep Whiff POC: POSITIVE

## 2011-05-11 MED ORDER — METRONIDAZOLE 500 MG PO TABS: 500.0000 mg | ORAL_TABLET | Freq: Two times a day (BID) | ORAL | Status: AC

## 2011-05-11 NOTE — Progress Notes (Signed)
Addended by: Madolyn Frieze, Marylene Land J on: 05/11/2011 05:05 PM   Modules accepted: Orders

## 2011-05-11 NOTE — Patient Instructions (Signed)
If your lab results are normal, I will send you a letter with the results. If abnormal, someone at the clinic will get in touch with you.   

## 2011-05-11 NOTE — Progress Notes (Signed)
  Subjective:    Patient ID: Candice Arellano, female    DOB: [birth year], 36 y.o.   MRN: [id number]  HPI Vaginal discharge for 2 weeks. Brownish-tinged. Denies vaginal itching, dysuria, fevers, back pain.  Requesting to be checked for STIs.   Last sexually active November 2012.  Boyfriend cheated on her. She had trichomoniasis at that time.   Review of Systems Per HPI. No recent antibiotic use.     Objective:   Physical Exam Gen: NAD GU: vulva and vagina normal without erythema or tenderness; cervix normal; no cervical motion tenderness    Assessment & Plan:

## 2011-05-11 NOTE — Progress Notes (Signed)
Addended by: Garen Grams F on: 05/11/2011 04:47 PM   Modules accepted: Orders

## 2011-05-11 NOTE — Assessment & Plan Note (Addendum)
Checking wet prep, GC/Chlamydia, HIV, RPR. UPDATE: BV. Rx for metronidazole. Called and notified patient.

## 2011-05-23 ENCOUNTER — Telehealth: Payer: Self-pay | Admitting: Family Medicine

## 2011-05-23 NOTE — Telephone Encounter (Signed)
Pt calling to get results from labs she took on 2/8.  Concerned she haven't received a call or letter to let her know what they were.

## 2011-05-24 NOTE — Telephone Encounter (Signed)
Called patient and left message saying except for BV, her other tests were negative. Will be sending letter specifying which tests were negative.

## 2011-06-14 ENCOUNTER — Other Ambulatory Visit: Payer: Self-pay | Admitting: Family Medicine

## 2011-06-14 NOTE — Telephone Encounter (Signed)
Refill request

## 2013-09-07 ENCOUNTER — Encounter: Payer: Self-pay | Admitting: Family Medicine

## 2013-09-15 ENCOUNTER — Encounter: Payer: Self-pay | Admitting: Family Medicine

## 2013-09-29 ENCOUNTER — Encounter: Payer: Self-pay | Admitting: Family Medicine

## 2013-11-02 ENCOUNTER — Encounter: Payer: Self-pay | Admitting: Family Medicine

## 2013-11-02 ENCOUNTER — Ambulatory Visit (INDEPENDENT_AMBULATORY_CARE_PROVIDER_SITE_OTHER): Payer: BC Managed Care – PPO | Admitting: Family Medicine

## 2013-11-02 VITALS — BP 118/86 | HR 72 | Ht 63.0 in | Wt 236.0 lb

## 2013-11-02 DIAGNOSIS — N920 Excessive and frequent menstruation with regular cycle: Secondary | ICD-10-CM

## 2013-11-02 DIAGNOSIS — Z124 Encounter for screening for malignant neoplasm of cervix: Secondary | ICD-10-CM | POA: Diagnosis not present

## 2013-11-02 DIAGNOSIS — Z1151 Encounter for screening for human papillomavirus (HPV): Secondary | ICD-10-CM

## 2013-11-02 DIAGNOSIS — Z01419 Encounter for gynecological examination (general) (routine) without abnormal findings: Secondary | ICD-10-CM

## 2013-11-02 DIAGNOSIS — Z113 Encounter for screening for infections with a predominantly sexual mode of transmission: Secondary | ICD-10-CM

## 2013-11-02 LAB — CBC
HEMATOCRIT: 27.3 % — AB (ref 36.0–46.0)
Hemoglobin: 8.7 g/dL — ABNORMAL LOW (ref 12.0–15.0)
MCH: 21 pg — ABNORMAL LOW (ref 26.0–34.0)
MCHC: 31.9 g/dL (ref 30.0–36.0)
MCV: 65.8 fL — ABNORMAL LOW (ref 78.0–100.0)
Platelets: 399 10*3/uL (ref 150–400)
RBC: 4.15 MIL/uL (ref 3.87–5.11)
RDW: 15.9 % — AB (ref 11.5–15.5)
WBC: 6.6 10*3/uL (ref 4.0–10.5)

## 2013-11-02 NOTE — Patient Instructions (Signed)
Preventive Care for Adults A healthy lifestyle and preventive care can promote health and wellness. Preventive health guidelines for women include the following key practices.  A routine yearly physical is a good way to check with your health care provider about your health and preventive screening. It is a chance to share any concerns and updates on your health and to receive a thorough exam.  Visit your dentist for a routine exam and preventive care every 6 months. Brush your teeth twice a day and floss once a day. Good oral hygiene prevents tooth decay and gum disease.  The frequency of eye exams is based on your age, health, family medical history, use of contact lenses, and other factors. Follow your health care provider's recommendations for frequency of eye exams.  Eat a healthy diet. Foods like vegetables, fruits, whole grains, low-fat dairy products, and lean protein foods contain the nutrients you need without too many calories. Decrease your intake of foods high in solid fats, added sugars, and salt. Eat the right amount of calories for you.Get information about a proper diet from your health care provider, if necessary.  Regular physical exercise is one of the most important things you can do for your health. Most adults should get at least 150 minutes of moderate-intensity exercise (any activity that increases your heart rate and causes you to sweat) each week. In addition, most adults need muscle-strengthening exercises on 2 or more days a week.  Maintain a healthy weight. The body mass index (BMI) is a screening tool to identify possible weight problems. It provides an estimate of body fat based on height and weight. Your health care provider can find your BMI and can help you achieve or maintain a healthy weight.For adults 20 years and older:  A BMI below 18.5 is considered underweight.  A BMI of 18.5 to 24.9 is normal.  A BMI of 25 to 29.9 is considered overweight.  A BMI of  30 and above is considered obese.  Maintain normal blood lipids and cholesterol levels by exercising and minimizing your intake of saturated fat. Eat a balanced diet with plenty of fruit and vegetables. Blood tests for lipids and cholesterol should begin at age 76 and be repeated every 5 years. If your lipid or cholesterol levels are high, you are over 50, or you are at high risk for heart disease, you may need your cholesterol levels checked more frequently.Ongoing high lipid and cholesterol levels should be treated with medicines if diet and exercise are not working.  If you smoke, find out from your health care provider how to quit. If you do not use tobacco, do not start.  Lung cancer screening is recommended for adults aged 22-80 years who are at high risk for developing lung cancer because of a history of smoking. A yearly low-dose CT scan of the lungs is recommended for people who have at least a 30-pack-year history of smoking and are a current smoker or have quit within the past 15 years. A pack year of smoking is smoking an average of 1 pack of cigarettes a day for 1 year (for example: 1 pack a day for 30 years or 2 packs a day for 15 years). Yearly screening should continue until the smoker has stopped smoking for at least 15 years. Yearly screening should be stopped for people who develop a health problem that would prevent them from having lung cancer treatment.  If you are pregnant, do not drink alcohol. If you are breastfeeding,  be very cautious about drinking alcohol. If you are not pregnant and choose to drink alcohol, do not have more than 1 drink per day. One drink is considered to be 12 ounces (355 mL) of beer, 5 ounces (148 mL) of wine, or 1.5 ounces (44 mL) of liquor.  Avoid use of street drugs. Do not share needles with anyone. Ask for help if you need support or instructions about stopping the use of drugs.  High blood pressure causes heart disease and increases the risk of  stroke. Your blood pressure should be checked at least every 1 to 2 years. Ongoing high blood pressure should be treated with medicines if weight loss and exercise do not work.  If you are 3-86 years old, ask your health care provider if you should take aspirin to prevent strokes.  Diabetes screening involves taking a blood sample to check your fasting blood sugar level. This should be done once every 3 years, after age 67, if you are within normal weight and without risk factors for diabetes. Testing should be considered at a younger age or be carried out more frequently if you are overweight and have at least 1 risk factor for diabetes.  Breast cancer screening is essential preventive care for women. You should practice "breast self-awareness." This means understanding the normal appearance and feel of your breasts and may include breast self-examination. Any changes detected, no matter how small, should be reported to a health care provider. Women in their 8s and 30s should have a clinical breast exam (CBE) by a health care provider as part of a regular health exam every 1 to 3 years. After age 70, women should have a CBE every year. Starting at age 25, women should consider having a mammogram (breast X-ray test) every year. Women who have a family history of breast cancer should talk to their health care provider about genetic screening. Women at a high risk of breast cancer should talk to their health care providers about having an MRI and a mammogram every year.  Breast cancer gene (BRCA)-related cancer risk assessment is recommended for women who have family members with BRCA-related cancers. BRCA-related cancers include breast, ovarian, tubal, and peritoneal cancers. Having family members with these cancers may be associated with an increased risk for harmful changes (mutations) in the breast cancer genes BRCA1 and BRCA2. Results of the assessment will determine the need for genetic counseling and  BRCA1 and BRCA2 testing.  Routine pelvic exams to screen for cancer are no longer recommended for nonpregnant women who are considered low risk for cancer of the pelvic organs (ovaries, uterus, and vagina) and who do not have symptoms. Ask your health care provider if a screening pelvic exam is right for you.  If you have had past treatment for cervical cancer or a condition that could lead to cancer, you need Pap tests and screening for cancer for at least 20 years after your treatment. If Pap tests have been discontinued, your risk factors (such as having a new sexual partner) need to be reassessed to determine if screening should be resumed. Some women have medical problems that increase the chance of getting cervical cancer. In these cases, your health care provider may recommend more frequent screening and Pap tests.  The HPV test is an additional test that may be used for cervical cancer screening. The HPV test looks for the virus that can cause the cell changes on the cervix. The cells collected during the Pap test can be  tested for HPV. The HPV test could be used to screen women aged 30 years and older, and should be used in women of any age who have unclear Pap test results. After the age of 30, women should have HPV testing at the same frequency as a Pap test.  Colorectal cancer can be detected and often prevented. Most routine colorectal cancer screening begins at the age of 50 years and continues through age 75 years. However, your health care provider may recommend screening at an earlier age if you have risk factors for colon cancer. On a yearly basis, your health care provider may provide home test kits to check for hidden blood in the stool. Use of a small camera at the end of a tube, to directly examine the colon (sigmoidoscopy or colonoscopy), can detect the earliest forms of colorectal cancer. Talk to your health care provider about this at age 50, when routine screening begins. Direct  exam of the colon should be repeated every 5-10 years through age 75 years, unless early forms of pre-cancerous polyps or small growths are found.  People who are at an increased risk for hepatitis B should be screened for this virus. You are considered at high risk for hepatitis B if:  You were born in a country where hepatitis B occurs often. Talk with your health care provider about which countries are considered high risk.  Your parents were born in a high-risk country and you have not received a shot to protect against hepatitis B (hepatitis B vaccine).  You have HIV or AIDS.  You use needles to inject street drugs.  You live with, or have sex with, someone who has hepatitis B.  You get hemodialysis treatment.  You take certain medicines for conditions like cancer, organ transplantation, and autoimmune conditions.  Hepatitis C blood testing is recommended for all people born from 1945 through 1965 and any individual with known risks for hepatitis C.  Practice safe sex. Use condoms and avoid high-risk sexual practices to reduce the spread of sexually transmitted infections (STIs). STIs include gonorrhea, chlamydia, syphilis, trichomonas, herpes, HPV, and human immunodeficiency virus (HIV). Herpes, HIV, and HPV are viral illnesses that have no cure. They can result in disability, cancer, and death.  You should be screened for sexually transmitted illnesses (STIs) including gonorrhea and chlamydia if:  You are sexually active and are younger than 24 years.  You are older than 24 years and your health care provider tells you that you are at risk for this type of infection.  Your sexual activity has changed since you were last screened and you are at an increased risk for chlamydia or gonorrhea. Ask your health care provider if you are at risk.  If you are at risk of being infected with HIV, it is recommended that you take a prescription medicine daily to prevent HIV infection. This is  called preexposure prophylaxis (PrEP). You are considered at risk if:  You are a heterosexual woman, are sexually active, and are at increased risk for HIV infection.  You take drugs by injection.  You are sexually active with a partner who has HIV.  Talk with your health care provider about whether you are at high risk of being infected with HIV. If you choose to begin PrEP, you should first be tested for HIV. You should then be tested every 3 months for as long as you are taking PrEP.  Osteoporosis is a disease in which the bones lose minerals and strength   with aging. This can result in serious bone fractures or breaks. The risk of osteoporosis can be identified using a bone density scan. Women ages 65 years and over and women at risk for fractures or osteoporosis should discuss screening with their health care providers. Ask your health care provider whether you should take a calcium supplement or vitamin D to reduce the rate of osteoporosis.  Menopause can be associated with physical symptoms and risks. Hormone replacement therapy is available to decrease symptoms and risks. You should talk to your health care provider about whether hormone replacement therapy is right for you.  Use sunscreen. Apply sunscreen liberally and repeatedly throughout the day. You should seek shade when your shadow is shorter than you. Protect yourself by wearing long sleeves, pants, a wide-brimmed hat, and sunglasses year round, whenever you are outdoors.  Once a month, do a whole body skin exam, using a mirror to look at the skin on your back. Tell your health care provider of new moles, moles that have irregular borders, moles that are larger than a pencil eraser, or moles that have changed in shape or color.  Stay current with required vaccines (immunizations).  Influenza vaccine. All adults should be immunized every year.  Tetanus, diphtheria, and acellular pertussis (Td, Tdap) vaccine. Pregnant women should  receive 1 dose of Tdap vaccine during each pregnancy. The dose should be obtained regardless of the length of time since the last dose. Immunization is preferred during the 27th-36th week of gestation. An adult who has not previously received Tdap or who does not know her vaccine status should receive 1 dose of Tdap. This initial dose should be followed by tetanus and diphtheria toxoids (Td) booster doses every 10 years. Adults with an unknown or incomplete history of completing a 3-dose immunization series with Td-containing vaccines should begin or complete a primary immunization series including a Tdap dose. Adults should receive a Td booster every 10 years.  Varicella vaccine. An adult without evidence of immunity to varicella should receive 2 doses or a second dose if she has previously received 1 dose. Pregnant females who do not have evidence of immunity should receive the first dose after pregnancy. This first dose should be obtained before leaving the health care facility. The second dose should be obtained 4-8 weeks after the first dose.  Human papillomavirus (HPV) vaccine. Females aged 13-26 years who have not received the vaccine previously should obtain the 3-dose series. The vaccine is not recommended for use in pregnant females. However, pregnancy testing is not needed before receiving a dose. If a female is found to be pregnant after receiving a dose, no treatment is needed. In that case, the remaining doses should be delayed until after the pregnancy. Immunization is recommended for any person with an immunocompromised condition through the age of 26 years if she did not get any or all doses earlier. During the 3-dose series, the second dose should be obtained 4-8 weeks after the first dose. The third dose should be obtained 24 weeks after the first dose and 16 weeks after the second dose.  Zoster vaccine. One dose is recommended for adults aged 60 years or older unless certain conditions are  present.  Measles, mumps, and rubella (MMR) vaccine. Adults born before 1957 generally are considered immune to measles and mumps. Adults born in 1957 or later should have 1 or more doses of MMR vaccine unless there is a contraindication to the vaccine or there is laboratory evidence of immunity to   each of the three diseases. A routine second dose of MMR vaccine should be obtained at least 28 days after the first dose for students attending postsecondary schools, health care workers, or international travelers. People who received inactivated measles vaccine or an unknown type of measles vaccine during 1963-1967 should receive 2 doses of MMR vaccine. People who received inactivated mumps vaccine or an unknown type of mumps vaccine before 1979 and are at high risk for mumps infection should consider immunization with 2 doses of MMR vaccine. For females of childbearing age, rubella immunity should be determined. If there is no evidence of immunity, females who are not pregnant should be vaccinated. If there is no evidence of immunity, females who are pregnant should delay immunization until after pregnancy. Unvaccinated health care workers born before 1957 who lack laboratory evidence of measles, mumps, or rubella immunity or laboratory confirmation of disease should consider measles and mumps immunization with 2 doses of MMR vaccine or rubella immunization with 1 dose of MMR vaccine.  Pneumococcal 13-valent conjugate (PCV13) vaccine. When indicated, a person who is uncertain of her immunization history and has no record of immunization should receive the PCV13 vaccine. An adult aged 19 years or older who has certain medical conditions and has not been previously immunized should receive 1 dose of PCV13 vaccine. This PCV13 should be followed with a dose of pneumococcal polysaccharide (PPSV23) vaccine. The PPSV23 vaccine dose should be obtained at least 8 weeks after the dose of PCV13 vaccine. An adult aged 19  years or older who has certain medical conditions and previously received 1 or more doses of PPSV23 vaccine should receive 1 dose of PCV13. The PCV13 vaccine dose should be obtained 1 or more years after the last PPSV23 vaccine dose.  Pneumococcal polysaccharide (PPSV23) vaccine. When PCV13 is also indicated, PCV13 should be obtained first. All adults aged 65 years and older should be immunized. An adult younger than age 65 years who has certain medical conditions should be immunized. Any person who resides in a nursing home or long-term care facility should be immunized. An adult smoker should be immunized. People with an immunocompromised condition and certain other conditions should receive both PCV13 and PPSV23 vaccines. People with human immunodeficiency virus (HIV) infection should be immunized as soon as possible after diagnosis. Immunization during chemotherapy or radiation therapy should be avoided. Routine use of PPSV23 vaccine is not recommended for American Indians, [place] Natives, or people younger than 65 years unless there are medical conditions that require PPSV23 vaccine. When indicated, people who have unknown immunization and have no record of immunization should receive PPSV23 vaccine. One-time revaccination 5 years after the first dose of PPSV23 is recommended for people aged 19-64 years who have chronic kidney failure, nephrotic syndrome, asplenia, or immunocompromised conditions. People who received 1-2 doses of PPSV23 before age 65 years should receive another dose of PPSV23 vaccine at age 65 years or later if at least 5 years have passed since the previous dose. Doses of PPSV23 are not needed for people immunized with PPSV23 at or after age 65 years.  Meningococcal vaccine. Adults with asplenia or persistent complement component deficiencies should receive 2 doses of quadrivalent meningococcal conjugate (MenACWY-D) vaccine. The doses should be obtained at least 2 months apart.  Microbiologists working with certain meningococcal bacteria, military recruits, people at risk during an outbreak, and people who travel to or live in countries with a high rate of meningitis should be immunized. A first-year college student up through age   21 years who is living in a residence hall should receive a dose if she did not receive a dose on or after her 16th birthday. Adults who have certain high-risk conditions should receive one or more doses of vaccine.  Hepatitis A vaccine. Adults who wish to be protected from this disease, have certain high-risk conditions, work with hepatitis A-infected animals, work in hepatitis A research labs, or travel to or work in countries with a high rate of hepatitis A should be immunized. Adults who were previously unvaccinated and who anticipate close contact with an international adoptee during the first 60 days after arrival in the [place] from a country with a high rate of hepatitis A should be immunized.  Hepatitis B vaccine. Adults who wish to be protected from this disease, have certain high-risk conditions, may be exposed to blood or other infectious body fluids, are household contacts or sex partners of hepatitis B positive people, are clients or workers in certain care facilities, or travel to or work in countries with a high rate of hepatitis B should be immunized.  Haemophilus influenzae type b (Hib) vaccine. A previously unvaccinated person with asplenia or sickle cell disease or having a scheduled splenectomy should receive 1 dose of Hib vaccine. Regardless of previous immunization, a recipient of a hematopoietic stem cell transplant should receive a 3-dose series 6-12 months after her successful transplant. Hib vaccine is not recommended for adults with HIV infection. Preventive Services / Frequency Ages 64 to 68 years  Blood pressure check.** / Every 1 to 2 years.  Lipid and cholesterol check.** / Every 5 years beginning at age  22.  Clinical breast exam.** / Every 3 years for women in their 88s and 53s.  BRCA-related cancer risk assessment.** / For women who have family members with a BRCA-related cancer (breast, ovarian, tubal, or peritoneal cancers).  Pap test.** / Every 2 years from ages 90 through 51. Every 3 years starting at age 21 through age 56 or 3 with a history of 3 consecutive normal Pap tests.  HPV screening.** / Every 3 years from ages 24 through ages 1 to 46 with a history of 3 consecutive normal Pap tests.  Hepatitis C blood test.** / For any individual with known risks for hepatitis C.  Skin self-exam. / Monthly.  Influenza vaccine. / Every year.  Tetanus, diphtheria, and acellular pertussis (Tdap, Td) vaccine.** / Consult your health care provider. Pregnant women should receive 1 dose of Tdap vaccine during each pregnancy. 1 dose of Td every 10 years.  Varicella vaccine.** / Consult your health care provider. Pregnant females who do not have evidence of immunity should receive the first dose after pregnancy.  HPV vaccine. / 3 doses over 6 months, if 72 and younger. The vaccine is not recommended for use in pregnant females. However, pregnancy testing is not needed before receiving a dose.  Measles, mumps, rubella (MMR) vaccine.** / You need at least 1 dose of MMR if you were born in 1957 or later. You may also need a 2nd dose. For females of childbearing age, rubella immunity should be determined. If there is no evidence of immunity, females who are not pregnant should be vaccinated. If there is no evidence of immunity, females who are pregnant should delay immunization until after pregnancy.  Pneumococcal 13-valent conjugate (PCV13) vaccine.** / Consult your health care provider.  Pneumococcal polysaccharide (PPSV23) vaccine.** / 1 to 2 doses if you smoke cigarettes or if you have certain conditions.  Meningococcal vaccine.** /  1 dose if you are age 19 to 21 years and a first-year college  student living in a residence hall, or have one of several medical conditions, you need to get vaccinated against meningococcal disease. You may also need additional booster doses.  Hepatitis A vaccine.** / Consult your health care provider.  Hepatitis B vaccine.** / Consult your health care provider.  Haemophilus influenzae type b (Hib) vaccine.** / Consult your health care provider. Ages 40 to 64 years  Blood pressure check.** / Every 1 to 2 years.  Lipid and cholesterol check.** / Every 5 years beginning at age 20 years.  Lung cancer screening. / Every year if you are aged 55-80 years and have a 30-pack-year history of smoking and currently smoke or have quit within the past 15 years. Yearly screening is stopped once you have quit smoking for at least 15 years or develop a health problem that would prevent you from having lung cancer treatment.  Clinical breast exam.** / Every year after age 40 years.  BRCA-related cancer risk assessment.** / For women who have family members with a BRCA-related cancer (breast, ovarian, tubal, or peritoneal cancers).  Mammogram.** / Every year beginning at age 40 years and continuing for as long as you are in good health. Consult with your health care provider.  Pap test.** / Every 3 years starting at age 30 years through age 65 or 70 years with a history of 3 consecutive normal Pap tests.  HPV screening.** / Every 3 years from ages 30 years through ages 65 to 70 years with a history of 3 consecutive normal Pap tests.  Fecal occult blood test (FOBT) of stool. / Every year beginning at age 50 years and continuing until age 75 years. You may not need to do this test if you get a colonoscopy every 10 years.  Flexible sigmoidoscopy or colonoscopy.** / Every 5 years for a flexible sigmoidoscopy or every 10 years for a colonoscopy beginning at age 50 years and continuing until age 75 years.  Hepatitis C blood test.** / For all people born from 1945 through  1965 and any individual with known risks for hepatitis C.  Skin self-exam. / Monthly.  Influenza vaccine. / Every year.  Tetanus, diphtheria, and acellular pertussis (Tdap/Td) vaccine.** / Consult your health care provider. Pregnant women should receive 1 dose of Tdap vaccine during each pregnancy. 1 dose of Td every 10 years.  Varicella vaccine.** / Consult your health care provider. Pregnant females who do not have evidence of immunity should receive the first dose after pregnancy.  Zoster vaccine.** / 1 dose for adults aged 60 years or older.  Measles, mumps, rubella (MMR) vaccine.** / You need at least 1 dose of MMR if you were born in 1957 or later. You may also need a 2nd dose. For females of childbearing age, rubella immunity should be determined. If there is no evidence of immunity, females who are not pregnant should be vaccinated. If there is no evidence of immunity, females who are pregnant should delay immunization until after pregnancy.  Pneumococcal 13-valent conjugate (PCV13) vaccine.** / Consult your health care provider.  Pneumococcal polysaccharide (PPSV23) vaccine.** / 1 to 2 doses if you smoke cigarettes or if you have certain conditions.  Meningococcal vaccine.** / Consult your health care provider.  Hepatitis A vaccine.** / Consult your health care provider.  Hepatitis B vaccine.** / Consult your health care provider.  Haemophilus influenzae type b (Hib) vaccine.** / Consult your health care provider. Ages 65   years and over  Blood pressure check.** / Every 1 to 2 years.  Lipid and cholesterol check.** / Every 5 years beginning at age 75 years.  Lung cancer screening. / Every year if you are aged 18-80 years and have a 30-pack-year history of smoking and currently smoke or have quit within the past 15 years. Yearly screening is stopped once you have quit smoking for at least 15 years or develop a health problem that would prevent you from having lung cancer  treatment.  Clinical breast exam.** / Every year after age 22 years.  BRCA-related cancer risk assessment.** / For women who have family members with a BRCA-related cancer (breast, ovarian, tubal, or peritoneal cancers).  Mammogram.** / Every year beginning at age 73 years and continuing for as long as you are in good health. Consult with your health care provider.  Pap test.** / Every 3 years starting at age 36 years through age 33 or 57 years with 3 consecutive normal Pap tests. Testing can be stopped between 65 and 70 years with 3 consecutive normal Pap tests and no abnormal Pap or HPV tests in the past 10 years.  HPV screening.** / Every 3 years from ages 70 years through ages 56 or 49 years with a history of 3 consecutive normal Pap tests. Testing can be stopped between 65 and 70 years with 3 consecutive normal Pap tests and no abnormal Pap or HPV tests in the past 10 years.  Fecal occult blood test (FOBT) of stool. / Every year beginning at age 39 years and continuing until age 46 years. You may not need to do this test if you get a colonoscopy every 10 years.  Flexible sigmoidoscopy or colonoscopy.** / Every 5 years for a flexible sigmoidoscopy or every 10 years for a colonoscopy beginning at age 46 years and continuing until age 27 years.  Hepatitis C blood test.** / For all people born from [birth year] through 1963 and any individual with known risks for hepatitis C.  Osteoporosis screening.** / A one-time screening for women ages 35 years and over and women at risk for fractures or osteoporosis.  Skin self-exam. / Monthly.  Influenza vaccine. / Every year.  Tetanus, diphtheria, and acellular pertussis (Tdap/Td) vaccine.** / 1 dose of Td every 10 years.  Varicella vaccine.** / Consult your health care provider.  Zoster vaccine.** / 1 dose for adults aged 25 years or older.  Pneumococcal 13-valent conjugate (PCV13) vaccine.** / Consult your health care provider.  Pneumococcal  polysaccharide (PPSV23) vaccine.** / 1 dose for all adults aged 72 years and older.  Meningococcal vaccine.** / Consult your health care provider.  Hepatitis A vaccine.** / Consult your health care provider.  Hepatitis B vaccine.** / Consult your health care provider.  Haemophilus influenzae type b (Hib) vaccine.** / Consult your health care provider. ** Family history and personal history of risk and conditions may change your health care provider's recommendations. Document Released: 05/15/2001 Document Revised: 08/03/2013 Document Reviewed: 08/14/2010 [facility] Patient Information 2015 [place], [place]. This information is not intended to replace advice given to you by your health care provider. Make sure you discuss any questions you have with your health care provider. Menorrhagia Menorrhagia is the medical term for when your menstrual periods are heavy or last longer than usual. With menorrhagia, every period you have may cause enough blood loss and cramping that you are unable to maintain your usual activities. CAUSES  In some cases, the cause of heavy periods is unknown, but a  number of conditions may cause menorrhagia. Common causes include:  A problem with the hormone-producing thyroid gland (hypothyroid).  Noncancerous growths in the uterus (polyps or fibroids).  An imbalance of the estrogen and progesterone hormones.  One of your ovaries not releasing an egg during one or more months.  Side effects of having an intrauterine device (IUD).  Side effects of some medicines, such as anti-inflammatory medicines or blood thinners.  A bleeding disorder that stops your blood from clotting normally. SIGNS AND SYMPTOMS  During a normal period, bleeding lasts between 4 and 8 days. Signs that your periods are too heavy include:  You routinely have to change your pad or tampon every 1 or 2 hours because it is completely soaked.  You pass blood clots larger than 1 inch (2.5 cm) in  size.  You have bleeding for more than 7 days.  You need to use pads and tampons at the same time because of heavy bleeding.  You need to wake up to change your pads or tampons during the night.  You have symptoms of anemia, such as tiredness, fatigue, or shortness of breath. DIAGNOSIS  Your health care provider will perform a physical exam and ask you questions about your symptoms and menstrual history. Other tests may be ordered based on what the health care provider finds during the exam. These tests can include:  Blood tests. Blood tests are used to check if you are pregnant or have hormonal changes, a bleeding or thyroid disorder, low iron levels (anemia), or other problems.  Endometrial biopsy. Your health care provider takes a sample of tissue from the inside of your uterus to be examined under a microscope.  Pelvic ultrasound. This test uses sound waves to make a picture of your uterus, ovaries, and vagina. The pictures can show if you have fibroids or other growths.  Hysteroscopy. For this test, your health care provider will use a small telescope to look inside your uterus. Based on the results of your initial tests, your health care provider may recommend further testing. TREATMENT  Treatment may not be needed. If it is needed, your health care provider may recommend treatment with one or more medicines first. If these do not reduce bleeding enough, a surgical treatment might be an option. The best treatment for you will depend on:   Whether you need to prevent pregnancy.  Your desire to have children in the future.  The cause and severity of your bleeding.  Your opinion and personal preference.  Medicines for menorrhagia may include:  Birth control methods that use hormones. These include the pill, skin patch, vaginal ring, shots that you get every 3 months, hormonal IUD, and implant. These treatments reduce bleeding during your menstrual period.  Medicines that  thicken blood and slow bleeding.  Medicines that reduce swelling, such as ibuprofen.  Medicines that contain a synthetic hormone called progestin.   Medicines that make the ovaries stop working for a short time.  You may need surgical treatment for menorrhagia if the medicines are unsuccessful. Treatment options include:  Dilation and curettage (D&C). In this procedure, your health care provider opens (dilates) your cervix and then scrapes or suctions tissue from the lining of your uterus to reduce menstrual bleeding.  Operative hysteroscopy. This procedure uses a tiny tube with a light (hysteroscope) to view your uterine cavity and can help in the surgical removal of a polyp that may be causing heavy periods.  Endometrial ablation. Through various techniques, your health care provider  permanently destroys the entire lining of your uterus (endometrium). After endometrial ablation, most women have little or no menstrual flow. Endometrial ablation reduces your ability to become pregnant.  Endometrial resection. This surgical procedure uses an electrosurgical wire loop to remove the lining of the uterus. This procedure also reduces your ability to become pregnant.  Hysterectomy. Surgical removal of the uterus and cervix is a permanent procedure that stops menstrual periods. Pregnancy is not possible after a hysterectomy. This procedure requires anesthesia and hospitalization. HOME CARE INSTRUCTIONS   Only take over-the-counter or prescription medicines as directed by your health care provider. Take prescribed medicines exactly as directed. Do not change or switch medicines without consulting your health care provider.  Take any prescribed iron pills exactly as directed by your health care provider. Long-term heavy bleeding may result in low iron levels. Iron pills help replace the iron your body lost from heavy bleeding. Iron may cause constipation. If this becomes a problem, increase the bran,  fruits, and roughage in your diet.  Do not take aspirin or medicines that contain aspirin 1 week before or during your menstrual period. Aspirin may make the bleeding worse.  If you need to change your sanitary pad or tampon more than once every 2 hours, stay in bed and rest as much as possible until the bleeding stops.  Eat well-balanced meals. Eat foods high in iron. Examples are leafy green vegetables, meat, liver, eggs, and whole grain breads and cereals. Do not try to lose weight until the abnormal bleeding has stopped and your blood iron level is back to normal. SEEK MEDICAL CARE IF:   You soak through a pad or tampon every 1 or 2 hours, and this happens every time you have a period.  You need to use pads and tampons at the same time because you are bleeding so much.  You need to change your pad or tampon during the night.  You have a period that lasts for more than 8 days.  You pass clots bigger than 1 inch wide.  You have irregular periods that happen more or less often than once a month.  You feel dizzy or faint.  You feel very weak or tired.  You feel short of breath or feel your heart is beating too fast when you exercise.  You have nausea and vomiting or diarrhea while you are taking your medicine.  You have any problems that may be related to the medicine you are taking. SEEK IMMEDIATE MEDICAL CARE IF:   You soak through 4 or more pads or tampons in 2 hours.  You have any bleeding while you are pregnant. MAKE SURE YOU:   Understand these instructions.  Will watch your condition.  Will get help right away if you are not doing well or get worse. Document Released: 03/19/2005 Document Revised: 03/24/2013 Document Reviewed: 09/07/2012 [facility] Patient Information 2015 [place], [place]. This information is not intended to replace advice given to you by your health care provider. Make sure you discuss any questions you have with your health care provider.

## 2013-11-02 NOTE — Progress Notes (Signed)
  Subjective:     Candice Arellano is a 38 y.o. female and is here for a comprehensive physical exam. The patient reports problems - heavy menstrual bleeding. Period Cycle (Days): 30 Period Duration (Days): 7 d Period Pattern: Regular Menstrual Flow: Heavy Dysmenorrhea: Moderate  History   Social History  . Marital Status: Married    Spouse Name: N/A    Number of Children: N/A  . Years of Education: N/A   Occupational History  . unemployed    Social History Main Topics  . Smoking status: Never Smoker   . Smokeless tobacco: Never Used  . Alcohol Use: Yes     Comment: occasionally  . Drug Use: No  . Sexual Activity: Yes    Partners: Male    Birth Control/ Protection: Surgical     Comment: tubal ligation   Other Topics Concern  . Not on file   Social History Narrative   Lives with parents and daughter and son. In a relationship for past year and a half.  Divorced.  Unemployed.  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   Health Maintenance  Topic Date Due  . Influenza Vaccine  01/02/2014 (Originally 10/31/2013)  . Pap Smear  01/11/2014  . Tetanus/tdap  01/11/2021    The following portions of the patient's history were reviewed and updated as appropriate: allergies, current medications, past family history, past medical history, past social history, past surgical history and problem list.  Review of Systems Pertinent items are noted in HPI.   Objective:    BP 118/86  Pulse 72  Ht 5\' 3"  (1.6 m)  Wt 236 lb (107.049 kg)  BMI 41.82 kg/m2  LMP 10/20/2013 General appearance: alert, cooperative, appears stated age and moderately obese Head: Normocephalic, without obvious abnormality, atraumatic Neck: no adenopathy, supple, symmetrical, trachea midline and thyroid not enlarged, symmetric, no tenderness/mass/nodules Lungs: clear to auscultation bilaterally Breasts: normal  appearance, no masses or tenderness Heart: regular rate and rhythm, S1, S2 normal, no murmur, click, rub or gallop Abdomen: soft, non-tender; bowel sounds normal; no masses,  no organomegaly Pelvic: cervix normal in appearance, external genitalia normal, no adnexal masses or tenderness, no cervical motion tenderness, uterus normal size, shape, and consistency, vagina normal without discharge and 8 wks size Extremities: extremities normal, atraumatic, no cyanosis or edema Pulses: 2+ and symmetric Skin: Skin color, texture, turgor normal. No rashes or lesions Lymph nodes: Cervical, supraclavicular, and axillary nodes normal. Neurologic: Grossly normal    Assessment:    Healthy female exam.      Plan:  Menorrhagia with regular cycle - Plan: CBC, TSH, US Pelvis Complete, US Transvaginal Non-OB  Screen for STD (sexually transmitted disease) - Plan: Cytology - PAP, HIV antibody, Hepatitis B surface antigen, Hepatitis C antibody, RPR  Screening for malignant neoplasm of the cervix - Plan: Cytology - PAP  Routine gynecological examination - Plan: CBC, TSH, Comprehensive metabolic panel, Lipid panel, Cytology - PAP     See After Visit Summary for Counseling Recommendations

## 2013-11-03 LAB — COMPREHENSIVE METABOLIC PANEL
ALT: 10 U/L (ref 0–35)
AST: 15 U/L (ref 0–37)
Albumin: 4 g/dL (ref 3.5–5.2)
Alkaline Phosphatase: 52 U/L (ref 39–117)
BUN: 9 mg/dL (ref 6–23)
CO2: 24 mEq/L (ref 19–32)
Calcium: 8.9 mg/dL (ref 8.4–10.5)
Chloride: 100 mEq/L (ref 96–112)
Creat: 0.61 mg/dL (ref 0.50–1.10)
Glucose, Bld: 92 mg/dL (ref 70–99)
Potassium: 3.7 mEq/L (ref 3.5–5.3)
SODIUM: 134 meq/L — AB (ref 135–145)
TOTAL PROTEIN: 7.6 g/dL (ref 6.0–8.3)
Total Bilirubin: 0.3 mg/dL (ref 0.2–1.2)

## 2013-11-03 LAB — HEPATITIS B SURFACE ANTIGEN: HEP B S AG: NEGATIVE

## 2013-11-03 LAB — RPR

## 2013-11-03 LAB — TSH: TSH: 1.335 u[IU]/mL (ref 0.350–4.500)

## 2013-11-03 LAB — LIPID PANEL
Cholesterol: 146 mg/dL (ref 0–200)
HDL: 52 mg/dL (ref >39)
LDL CALC: 73 mg/dL (ref 0–99)
Total CHOL/HDL Ratio: 2.8 Ratio
Triglycerides: 103 mg/dL (ref <150)
VLDL: 21 mg/dL (ref 0–40)

## 2013-11-03 LAB — HEPATITIS C ANTIBODY: HCV Ab: NEGATIVE

## 2013-11-03 LAB — HIV ANTIBODY (ROUTINE TESTING W REFLEX): HIV 1&2 Ab, 4th Generation: NONREACTIVE

## 2013-11-04 ENCOUNTER — Telehealth: Payer: Self-pay | Admitting: *Deleted

## 2013-11-04 NOTE — Telephone Encounter (Signed)
Pt aware.  Pt will get Iron over the counter.

## 2013-11-04 NOTE — Telephone Encounter (Signed)
Message copied by Grayland OrmondHINTON, Latajah Thuman C on Wed Nov 04, 2013  8:02 AM ------      Message from: Reva BoresPRATT, TANYA S      Created: Tue Nov 03, 2013  8:42 AM       Labs are normal--except she is really anemic--will need iron--it constipates her ------

## 2013-11-05 LAB — CYTOLOGY - PAP

## 2013-11-07 ENCOUNTER — Encounter: Payer: Self-pay | Admitting: Family Medicine

## 2013-11-07 DIAGNOSIS — D069 Carcinoma in situ of cervix, unspecified: Secondary | ICD-10-CM | POA: Insufficient documentation

## 2014-02-01 ENCOUNTER — Encounter: Payer: Self-pay | Admitting: Family Medicine

## 2014-05-03 ENCOUNTER — Encounter: Payer: Self-pay | Admitting: Family Medicine

## 2014-05-03 ENCOUNTER — Ambulatory Visit (INDEPENDENT_AMBULATORY_CARE_PROVIDER_SITE_OTHER): Payer: Federal, State, Local not specified - PPO | Admitting: Family Medicine

## 2014-05-03 VITALS — BP 117/86 | HR 84 | Ht 63.0 in | Wt 238.8 lb

## 2014-05-03 DIAGNOSIS — N920 Excessive and frequent menstruation with regular cycle: Secondary | ICD-10-CM

## 2014-05-03 DIAGNOSIS — Z23 Encounter for immunization: Secondary | ICD-10-CM

## 2014-05-03 MED ORDER — MEGESTROL ACETATE 40 MG PO TABS: 40.0000 mg | ORAL_TABLET | Freq: Two times a day (BID) | ORAL | Status: DC

## 2014-05-03 NOTE — Progress Notes (Signed)
    Subjective:    Patient ID: Candice Arellano is a 39 y.o. female presenting with Metrorrhagia  on 05/03/2014  HPI: Patient is having spotting and bleeding pretty much all of the time.  She has currently been spotting since January 20 and it turned into heavy bleeding on the 30th and she is still bleeding "pretty steady"  Changing a pad every two hours.  She also has increased pain with the bleeding.  She notes bleeding heavily since C-section and BTL. W/u shows normal thyroid. No pelvic sono. She has a rash on her back as well that has been there for months.  She has used cortisone cream on it but its not going away.   She would also like a flu shot today.   Review of Systems  Constitutional: Negative for fever and chills.  Respiratory: Negative for shortness of breath.   Cardiovascular: Negative for chest pain.  Gastrointestinal: Negative for nausea, vomiting and abdominal pain.  Endocrine:       Bruising on legs.  Genitourinary: Negative for dysuria.  Skin: Positive for rash.      Objective:    BP 117/86 mmHg  Pulse 84  Ht 5\' 3"  (1.6 m)  Wt 238 lb 12.8 oz (108.319 kg)  BMI 42.31 kg/m2  LMP 04/21/2014 Physical Exam  Constitutional: She is oriented to person, place, and time. She appears well-developed and well-nourished. No distress.  HENT:  Head: Normocephalic and atraumatic.  Eyes: No scleral icterus.  Neck: Neck supple.  Cardiovascular: Normal rate.   Pulmonary/Chest: Effort normal.  Abdominal: Soft.  Neurological: She is alert and oriented to person, place, and time.  Skin: Skin is warm and dry.  Psychiatric: She has a normal mood and affect.   Procedure: Patient given informed consent, signed copy in the chart, time out was performed. Appropriate time out taken. . The patient was placed in the lithotomy position and the cervix brought into view with sterile speculum.  Portio of cervix cleansed x 2 with betadine swabs.  A tenaculum was placed in the anterior lip  of the cervix.  The uterus was sounded for depth of 9 cm. A pipelle was introduced to into the uterus, suction created, and an endometrial sample was obtained. All equipment was removed and accounted for.  The patient tolerated the procedure well.   Patient given post procedure instructions.      Assessment & Plan:   Problem List Items Addressed This Visit      Unprioritized   Menorrhagia    Discussed treatment options, including oral medications, IUD, endometrial ablation and hysterectomy--depending on findings. Check EMB and pelvic sono. Megace to stop bleeding.      Relevant Medications   megestrol (MEGACE) tablet   Other Relevant Orders   Surgical pathology    Other Visit Diagnoses    Need for immunization against influenza    -  Primary    Relevant Orders    Flu Vaccine QUAD 36+ mos IM (Fluarix) (Completed)         Return in about 4 weeks (around 05/31/2014) for a follow-up.

## 2014-05-03 NOTE — Progress Notes (Signed)
Patient is having spotting and bleeding pretty much all of the time.  She has currently been spotting since January 20 and it turned into heavy bleeding on the 30th and she is still bleeding "pretty steady"  Changing a pad every two hours.  She also has increased pain with the bleeding.  She has a rash on her back as well that has been there for months.  She has used cortisone cream on it but its not going away.  She would also like a flu shot today.

## 2014-05-03 NOTE — Assessment & Plan Note (Addendum)
Discussed treatment options, including oral medications, IUD, endometrial ablation and hysterectomy--depending on findings. Check EMB and pelvic sono. Megace to stop bleeding.

## 2014-05-03 NOTE — Patient Instructions (Signed)
Eczema Eczema, also called atopic dermatitis, is a skin disorder that causes inflammation of the skin. It causes a red rash and dry, scaly skin. The skin becomes very itchy. Eczema is generally worse during the cooler winter months and often improves with the warmth of summer. Eczema usually starts showing signs in infancy. Some children outgrow eczema, but it may last through adulthood.  CAUSES  The exact cause of eczema is not known, but it appears to run in families. People with eczema often have a family history of eczema, allergies, asthma, or hay fever. Eczema is not contagious. Flare-ups of the condition may be caused by:   Contact with something you are sensitive or allergic to.   Stress. SIGNS AND SYMPTOMS  Dry, scaly skin.   Red, itchy rash.   Itchiness. This may occur before the skin rash and may be very intense.  DIAGNOSIS  The diagnosis of eczema is usually made based on symptoms and medical history. TREATMENT  Eczema cannot be cured, but symptoms usually can be controlled with treatment and other strategies. A treatment plan might include:  Controlling the itching and scratching.   Use over-the-counter antihistamines as directed for itching. This is especially useful at night when the itching tends to be worse.   Use over-the-counter steroid creams as directed for itching.   Avoid scratching. Scratching makes the rash and itching worse. It may also result in a skin infection (impetigo) due to a break in the skin caused by scratching.   Keeping the skin well moisturized with creams every day. This will seal in moisture and help prevent dryness. Lotions that contain alcohol and water should be avoided because they can dry the skin.   Limiting exposure to things that you are sensitive or allergic to (allergens).   Recognizing situations that cause stress.   Developing a plan to manage stress.  HOME CARE INSTRUCTIONS   Only take over-the-counter or  prescription medicines as directed by your health care provider.   Do not use anything on the skin without checking with your health care provider.   Keep baths or showers short (5 minutes) in warm (not hot) water. Use mild cleansers for bathing. These should be unscented. You may add nonperfumed bath oil to the bath water. It is best to avoid soap and bubble bath.   Immediately after a bath or shower, when the skin is still damp, apply a moisturizing ointment to the entire body. This ointment should be a petroleum ointment. This will seal in moisture and help prevent dryness. The thicker the ointment, the better. These should be unscented.   Keep fingernails cut short. Children with eczema may need to wear soft gloves or mittens at night after applying an ointment.   Dress in clothes made of cotton or cotton blends. Dress lightly, because heat increases itching.   A child with eczema should stay away from anyone with fever blisters or cold sores. The virus that causes fever blisters (herpes simplex) can cause a serious skin infection in children with eczema. SEEK MEDICAL CARE IF:   Your itching interferes with sleep.   Your rash gets worse or is not better within 1 week after starting treatment.   You see pus or soft yellow scabs in the rash area.   You have a fever.   You have a rash flare-up after contact with someone who has fever blisters.  Document Released: 03/16/2000 Document Revised: 01/07/2013 Document Reviewed: 10/20/2012 [employer] Patient Information 2015 [employer]. This information   is not intended to replace advice given to you by your health care provider. Make sure you discuss any questions you have with your health care provider. Tinea Versicolor Tinea versicolor is a common yeast infection of the skin. This condition becomes known when the yeast on our skin starts to overgrow (yeast is a normal inhabitant on our skin). This condition is noticed as white or  light brown patches on brown skin, and is more evident in the summer on tanned skin. These areas are slightly scaly if scratched. The light patches from the yeast become evident when the yeast creates "holes in your suntan". This is most often noticed in the summer. The patches are usually located on the chest, back, pubis, neck and body folds. However, it may occur on any area of body. Mild itching and inflammation (redness or soreness) may be present. DIAGNOSIS  The diagnosisof this is made clinically (by looking). Cultures from samples are usually not needed. Examination under the microscope may help. However, yeast is normally found on skin. The diagnosis still remains clinical. Examination under [employer]'s Ultraviolet Light can determine the extent of the infection. TREATMENT  This common infection is usually only of cosmetic (only a concern to your appearance). It is easily treated with dandruff shampoo used during showers or bathing. Vigorous scrubbing will eliminate the yeast over several days time. The light areas in your skin may remain for weeks or months after the infection is cured unless your skin is exposed to sunlight. The lighter or darker spots caused by the fungus that remain after complete treatment are not a sign of treatment failure; it will take a long time to resolve. Your caregiver may recommend a number of commercial preparations or medication by mouth if home care is not working. Recurrence is common and preventative medication may be necessary. This skin condition is not highly contagious. Special care is not needed to protect close friends and family members. Normal hygiene is usually enough. Follow up is required only if you develop complications (such as a secondary infection from scratching), if recommended by your caregiver, or if no relief is obtained from the preparations used. Document Released: 03/16/2000 Document Revised: 06/11/2011 Document Reviewed: 04/28/2008 [facility]  Patient Information 2015 [place]. This information is not intended to replace advice given to you by your health care provider. Make sure you discuss any questions you have with your health care provider. Menorrhagia Menorrhagia is the medical term for when your menstrual periods are heavy or last longer than usual. With menorrhagia, every period you have may cause enough blood loss and cramping that you are unable to maintain your usual activities. CAUSES  In some cases, the cause of heavy periods is unknown, but a number of conditions may cause menorrhagia. Common causes include:  A problem with the hormone-producing thyroid gland (hypothyroid).  Noncancerous growths in the uterus (polyps or fibroids).  An imbalance of the estrogen and progesterone hormones.  One of your ovaries not releasing an egg during one or more months.  Side effects of having an intrauterine device (IUD).  Side effects of some medicines, such as anti-inflammatory medicines or blood thinners.  A bleeding disorder that stops your blood from clotting normally. SIGNS AND SYMPTOMS  During a normal period, bleeding lasts between 4 and 8 days. Signs that your periods are too heavy include:  You routinely have to change your pad or tampon every 1 or 2 hours because it is completely soaked.  You pass blood clots larger  than 1 inch (2.5 cm) in size.  You have bleeding for more than 7 days.  You need to use pads and tampons at the same time because of heavy bleeding.  You need to wake up to change your pads or tampons during the night.  You have symptoms of anemia, such as tiredness, fatigue, or shortness of breath. DIAGNOSIS  Your health care provider will perform a physical exam and ask you questions about your symptoms and menstrual history. Other tests may be ordered based on what the health care provider finds during the exam. These tests can include:  Blood tests. Blood tests are used to check if you  are pregnant or have hormonal changes, a bleeding or thyroid disorder, low iron levels (anemia), or other problems.  Endometrial biopsy. Your health care provider takes a sample of tissue from the inside of your uterus to be examined under a microscope.  Pelvic ultrasound. This test uses sound waves to make a picture of your uterus, ovaries, and vagina. The pictures can show if you have fibroids or other growths.  Hysteroscopy. For this test, your health care provider will use a small telescope to look inside your uterus. Based on the results of your initial tests, your health care provider may recommend further testing. TREATMENT  Treatment may not be needed. If it is needed, your health care provider may recommend treatment with one or more medicines first. If these do not reduce bleeding enough, a surgical treatment might be an option. The best treatment for you will depend on:   Whether you need to prevent pregnancy.  Your desire to have children in the future.  The cause and severity of your bleeding.  Your opinion and personal preference.  Medicines for menorrhagia may include:  Birth control methods that use hormones. These include the pill, skin patch, vaginal ring, shots that you get every 3 months, hormonal IUD, and implant. These treatments reduce bleeding during your menstrual period.  Medicines that thicken blood and slow bleeding.  Medicines that reduce swelling, such as ibuprofen.  Medicines that contain a synthetic hormone called progestin.   Medicines that make the ovaries stop working for a short time.  You may need surgical treatment for menorrhagia if the medicines are unsuccessful. Treatment options include:  Dilation and curettage (D&C). In this procedure, your health care provider opens (dilates) your cervix and then scrapes or suctions tissue from the lining of your uterus to reduce menstrual bleeding.  Operative hysteroscopy. This procedure uses a tiny  tube with a light (hysteroscope) to view your uterine cavity and can help in the surgical removal of a polyp that may be causing heavy periods.  Endometrial ablation. Through various techniques, your health care provider permanently destroys the entire lining of your uterus (endometrium). After endometrial ablation, most women have little or no menstrual flow. Endometrial ablation reduces your ability to become pregnant.  Endometrial resection. This surgical procedure uses an electrosurgical wire loop to remove the lining of the uterus. This procedure also reduces your ability to become pregnant.  Hysterectomy. Surgical removal of the uterus and cervix is a permanent procedure that stops menstrual periods. Pregnancy is not possible after a hysterectomy. This procedure requires anesthesia and hospitalization. HOME CARE INSTRUCTIONS   Only take over-the-counter or prescription medicines as directed by your health care provider. Take prescribed medicines exactly as directed. Do not change or switch medicines without consulting your health care provider.  Take any prescribed iron pills exactly as directed by your  health care provider. Long-term heavy bleeding may result in low iron levels. Iron pills help replace the iron your body lost from heavy bleeding. Iron may cause constipation. If this becomes a problem, increase the bran, fruits, and roughage in your diet.  Do not take aspirin or medicines that contain aspirin 1 week before or during your menstrual period. Aspirin may make the bleeding worse.  If you need to change your sanitary pad or tampon more than once every 2 hours, stay in bed and rest as much as possible until the bleeding stops.  Eat well-balanced meals. Eat foods high in iron. Examples are leafy green vegetables, meat, liver, eggs, and whole grain breads and cereals. Do not try to lose weight until the abnormal bleeding has stopped and your blood iron level is back to normal. SEEK  MEDICAL CARE IF:   You soak through a pad or tampon every 1 or 2 hours, and this happens every time you have a period.  You need to use pads and tampons at the same time because you are bleeding so much.  You need to change your pad or tampon during the night.  You have a period that lasts for more than 8 days.  You pass clots bigger than 1 inch wide.  You have irregular periods that happen more or less often than once a month.  You feel dizzy or faint.  You feel very weak or tired.  You feel short of breath or feel your heart is beating too fast when you exercise.  You have nausea and vomiting or diarrhea while you are taking your medicine.  You have any problems that may be related to the medicine you are taking. SEEK IMMEDIATE MEDICAL CARE IF:   You soak through 4 or more pads or tampons in 2 hours.  You have any bleeding while you are pregnant. MAKE SURE YOU:   Understand these instructions.  Will watch your condition.  Will get help right away if you are not doing well or get worse. Document Released: 03/19/2005 Document Revised: 03/24/2013 Document Reviewed: 09/07/2012 [facility] Patient Information 2015 [place], [place]. This information is not intended to replace advice given to you by your health care provider. Make sure you discuss any questions you have with your health care provider.

## 2014-05-07 ENCOUNTER — Ambulatory Visit (HOSPITAL_COMMUNITY)
Admission: RE | Admit: 2014-05-07 | Discharge: 2014-05-07 | Disposition: A | Discharge status: Home / Self Care | Payer: Federal, State, Local not specified - PPO | Source: Ambulatory Visit | Attending: Family Medicine | Admitting: Family Medicine

## 2014-05-07 DIAGNOSIS — D649 Anemia, unspecified: Secondary | ICD-10-CM | POA: Insufficient documentation

## 2014-05-07 DIAGNOSIS — N920 Excessive and frequent menstruation with regular cycle: Secondary | ICD-10-CM | POA: Diagnosis not present

## 2014-05-12 ENCOUNTER — Telehealth: Payer: Self-pay | Admitting: *Deleted

## 2014-05-12 NOTE — Telephone Encounter (Signed)
Patient is calling for test results.  Notified patient of ultrasound and biopsy results, she will keep her follow up appointment to discuss options.

## 2014-06-03 ENCOUNTER — Encounter: Payer: Self-pay | Admitting: Family Medicine

## 2014-06-03 ENCOUNTER — Ambulatory Visit (INDEPENDENT_AMBULATORY_CARE_PROVIDER_SITE_OTHER): Payer: Federal, State, Local not specified - PPO | Admitting: Family Medicine

## 2014-06-03 VITALS — BP 113/82 | HR 76 | Wt 237.0 lb

## 2014-06-03 DIAGNOSIS — N920 Excessive and frequent menstruation with regular cycle: Secondary | ICD-10-CM

## 2014-06-03 DIAGNOSIS — M5432 Sciatica, left side: Secondary | ICD-10-CM | POA: Diagnosis not present

## 2014-06-03 DIAGNOSIS — M5430 Sciatica, unspecified side: Secondary | ICD-10-CM | POA: Insufficient documentation

## 2014-06-03 MED ORDER — MEGESTROL ACETATE 40 MG PO TABS: 40.0000 mg | ORAL_TABLET | Freq: Two times a day (BID) | ORAL | Status: DC

## 2014-06-03 NOTE — Assessment & Plan Note (Signed)
Trial of OTC anti-inflammatories and exercising, ice and heat--may need PT referral if no relief.

## 2014-06-03 NOTE — Assessment & Plan Note (Signed)
Trial of Megace for now.  Other options discussed at length with the pt. Including OC's, IUD (mirena), endometrial ablation and hysterectomy. Opts for Megace to use cyclicly for now--leaning toward IUD insertion.  Information given to pt.

## 2014-06-03 NOTE — Progress Notes (Signed)
    Subjective:    Patient ID: Candice Arellano is a 39 y.o. female presenting with Follow-up  on 06/03/2014  HPI: Previously seen for heavy vaginal bleeding.  Placed on Megace which stopped the bleeding.  Bleeding has returned. W/u showed one small fibroid 1.8. EMB normal.  Very anemic. Also, c/o sciatica, not improved with sitting on donut at work.  Review of Systems  Constitutional: Negative for fever and chills.  Respiratory: Negative for shortness of breath.   Cardiovascular: Negative for chest pain.  Gastrointestinal: Negative for nausea, vomiting and abdominal pain.  Genitourinary: Positive for vaginal bleeding. Negative for dysuria.  Skin: Negative for rash.      Objective:    BP 113/82 mmHg  Pulse 76  Wt 237 lb (107.502 kg)  LMP 05/24/2014 Physical Exam  Constitutional: Candice Arellano is oriented to person, place, and time. Candice Arellano appears well-developed and well-nourished. No distress.  HENT:  Head: Normocephalic and atraumatic.  Eyes: No scleral icterus.  Neck: Neck supple.  Cardiovascular: Normal rate.   Pulmonary/Chest: Effort normal.  Abdominal: Soft.  Neurological: Candice Arellano is alert and oriented to person, place, and time.  Skin: Skin is warm and dry.  Psychiatric: Candice Arellano has a normal mood and affect.        Assessment & Plan:   Problem List Items Addressed This Visit      Unprioritized   Menorrhagia - Primary    Trial of Megace for now.  Other options discussed at length with the pt. Including OC's, IUD (mirena), endometrial ablation and hysterectomy. Opts for Megace to use cyclicly for now--leaning toward IUD insertion.  Information given to pt.      Relevant Medications   megestrol (MEGACE) tablet   Sciatica    Trial of OTC anti-inflammatories and exercising, ice and heat--may need PT referral if no relief.

## 2014-06-03 NOTE — Patient Instructions (Addendum)
Levonorgestrel intrauterine device (IUD) What is this medicine? LEVONORGESTREL IUD (LEE voe nor jes trel) is a contraceptive (birth control) device. The device is placed inside the uterus by a healthcare professional. It is used to prevent pregnancy and can also be used to treat heavy bleeding that occurs during your period. Depending on the device, it can be used for 3 to 5 years. This medicine may be used for other purposes; ask your health care provider or pharmacist if you have questions. COMMON BRAND NAME(S): LILETTA, Mirena, Skyla What should I tell my health care provider before I take this medicine? They need to know if you have any of these conditions: -abnormal Pap smear -cancer of the breast, uterus, or cervix -diabetes -endometritis -genital or pelvic infection now or in the past -have more than one sexual partner or your partner has more than one partner -heart disease -history of an ectopic or tubal pregnancy -immune system problems -IUD in place -liver disease or tumor -problems with blood clots or take blood-thinners -use intravenous drugs -uterus of unusual shape -vaginal bleeding that has not been explained -an unusual or allergic reaction to levonorgestrel, other hormones, silicone, or polyethylene, medicines, foods, dyes, or preservatives -pregnant or trying to get pregnant -breast-feeding How should I use this medicine? This device is placed inside the uterus by a health care professional. Talk to your pediatrician regarding the use of this medicine in children. Special care may be needed. Overdosage: If you think you have taken too much of this medicine contact a poison control center or emergency room at once. NOTE: This medicine is only for you. Do not share this medicine with others. What if I miss a dose? This does not apply. What may interact with this medicine? Do not take this medicine with any of the following  medications: -amprenavir -bosentan -fosamprenavir This medicine may also interact with the following medications: -aprepitant -barbiturate medicines for inducing sleep or treating seizures -bexarotene -griseofulvin -medicines to treat seizures like carbamazepine, ethotoin, felbamate, oxcarbazepine, phenytoin, topiramate -modafinil -pioglitazone -rifabutin -rifampin -rifapentine -some medicines to treat HIV infection like atazanavir, indinavir, lopinavir, nelfinavir, tipranavir, ritonavir -St. John's wort -warfarin This list may not describe all possible interactions. Give your health care provider a list of all the medicines, herbs, non-prescription drugs, or dietary supplements you use. Also tell them if you smoke, drink alcohol, or use illegal drugs. Some items may interact with your medicine. What should I watch for while using this medicine? Visit your doctor or health care professional for regular check ups. See your doctor if you or your partner has sexual contact with others, becomes HIV positive, or gets a sexual transmitted disease. This product does not protect you against HIV infection (AIDS) or other sexually transmitted diseases. You can check the placement of the IUD yourself by reaching up to the top of your vagina with clean fingers to feel the threads. Do not pull on the threads. It is a good habit to check placement after each menstrual period. Call your doctor right away if you feel more of the IUD than just the threads or if you cannot feel the threads at all. The IUD may come out by itself. You may become pregnant if the device comes out. If you notice that the IUD has come out use a backup birth control method like condoms and call your health care provider. Using tampons will not change the position of the IUD and are okay to use during your period. What side effects may   I notice from receiving this medicine? Side effects that you should report to your doctor or  health care professional as soon as possible: -allergic reactions like skin rash, itching or hives, swelling of the face, lips, or tongue -fever, flu-like symptoms -genital sores -high blood pressure -no menstrual period for 6 weeks during use -pain, swelling, warmth in the leg -pelvic pain or tenderness -severe or sudden headache -signs of pregnancy -stomach cramping -sudden shortness of breath -trouble with balance, talking, or walking -unusual vaginal bleeding, discharge -yellowing of the eyes or skin Side effects that usually do not require medical attention (report to your doctor or health care professional if they continue or are bothersome): -acne -breast pain -change in sex drive or performance -changes in weight -cramping, dizziness, or faintness while the device is being inserted -headache -irregular menstrual bleeding within first 3 to 6 months of use -nausea This list may not describe all possible side effects. Call your doctor for medical advice about side effects. You may report side effects to FDA at 1-800-FDA-1088. Where should I keep my medicine? This does not apply. NOTE: This sheet is a summary. It may not cover all possible information. If you have questions about this medicine, talk to your doctor, pharmacist, or health care provider.  2015, Elsevier/Gold Standard. (2011-04-19 13:54:04) Sciatica Sciatica is pain, weakness, numbness, or tingling along the path of the sciatic nerve. The nerve starts in the lower back and runs down the back of each leg. The nerve controls the muscles in the lower leg and in the back of the knee, while also providing sensation to the back of the thigh, lower leg, and the sole of your foot. Sciatica is a symptom of another medical condition. For instance, nerve damage or certain conditions, such as a herniated disk or bone spur on the spine, pinch or put pressure on the sciatic nerve. This causes the pain, weakness, or other sensations  normally associated with sciatica. Generally, sciatica only affects one side of the body. CAUSES   Herniated or slipped disc.  Degenerative disk disease.  A pain disorder involving the narrow muscle in the buttocks (piriformis syndrome).  Pelvic injury or fracture.  Pregnancy.  Tumor (rare). SYMPTOMS  Symptoms can vary from mild to very severe. The symptoms usually travel from the low back to the buttocks and down the back of the leg. Symptoms can include:  Mild tingling or dull aches in the lower back, leg, or hip.  Numbness in the back of the calf or sole of the foot.  Burning sensations in the lower back, leg, or hip.  Sharp pains in the lower back, leg, or hip.  Leg weakness.  Severe back pain inhibiting movement. These symptoms may get worse with coughing, sneezing, laughing, or prolonged sitting or standing. Also, being overweight may worsen symptoms. DIAGNOSIS  Your caregiver will perform a physical exam to look for common symptoms of sciatica. He or she may ask you to do certain movements or activities that would trigger sciatic nerve pain. Other tests may be performed to find the cause of the sciatica. These may include:  Blood tests.  X-rays.  Imaging tests, such as an MRI or CT scan. TREATMENT  Treatment is directed at the cause of the sciatic pain. Sometimes, treatment is not necessary and the pain and discomfort goes away on its own. If treatment is needed, your caregiver may suggest:  Over-the-counter medicines to relieve pain.  Prescription medicines, such as anti-inflammatory medicine, muscle relaxants, or narcotics.  Applying heat or ice to the painful area.  Steroid injections to lessen pain, irritation, and inflammation around the nerve.  Reducing activity during periods of pain.  Exercising and stretching to strengthen your abdomen and improve flexibility of your spine. Your caregiver may suggest losing weight if the extra weight makes the back  pain worse.  Physical therapy.  Surgery to eliminate what is pressing or pinching the nerve, such as a bone spur or part of a herniated disk. HOME CARE INSTRUCTIONS   Only take over-the-counter or prescription medicines for pain or discomfort as directed by your caregiver.  Apply ice to the affected area for 20 minutes, 3-4 times a day for the first 48-72 hours. Then try heat in the same way.  Exercise, stretch, or perform your usual activities if these do not aggravate your pain.  Attend physical therapy sessions as directed by your caregiver.  Keep all follow-up appointments as directed by your caregiver.  Do not wear high heels or shoes that do not provide proper support.  Check your mattress to see if it is too soft. A firm mattress may lessen your pain and discomfort. SEEK IMMEDIATE MEDICAL CARE IF:   You lose control of your bowel or bladder (incontinence).  You have increasing weakness in the lower back, pelvis, buttocks, or legs.  You have redness or swelling of your back.  You have a burning sensation when you urinate.  You have pain that gets worse when you lie down or awakens you at night.  Your pain is worse than you have experienced in the past.  Your pain is lasting longer than 4 weeks.  You are suddenly losing weight without reason. MAKE SURE YOU:  Understand these instructions.  Will watch your condition.  Will get help right away if you are not doing well or get worse. Document Released: 03/13/2001 Document Revised: 09/18/2011 Document Reviewed: 07/29/2011 [facility] Patient Information 2015 [place], [place]. This information is not intended to replace advice given to you by your health care provider. Make sure you discuss any questions you have with your health care provider.

## 2014-06-10 ENCOUNTER — Telehealth: Payer: Self-pay | Admitting: *Deleted

## 2014-06-10 DIAGNOSIS — B379 Candidiasis, unspecified: Secondary | ICD-10-CM

## 2014-06-10 MED ORDER — FLUCONAZOLE 150 MG PO TABS: 150.0000 mg | ORAL_TABLET | Freq: Every day | ORAL | Status: DC

## 2014-06-10 NOTE — Telephone Encounter (Signed)
Patient is requesting a Diflucan for a yeast infection.  She also wanted to let us know that she switched to liquid iron to see if she could tolerate this better.

## 2014-06-21 ENCOUNTER — Emergency Department (HOSPITAL_COMMUNITY)
Admission: EM | Admit: 2014-06-21 | Discharge: 2014-06-22 | Disposition: A | Discharge status: Home / Self Care | Payer: Federal, State, Local not specified - PPO | Attending: Emergency Medicine | Admitting: Emergency Medicine

## 2014-06-21 ENCOUNTER — Encounter (HOSPITAL_COMMUNITY): Payer: Self-pay | Admitting: *Deleted

## 2014-06-21 ENCOUNTER — Emergency Department (HOSPITAL_COMMUNITY): Payer: Federal, State, Local not specified - PPO

## 2014-06-21 DIAGNOSIS — Y998 Other external cause status: Secondary | ICD-10-CM | POA: Diagnosis not present

## 2014-06-21 DIAGNOSIS — W231XXA Caught, crushed, jammed, or pinched between stationary objects, initial encounter: Secondary | ICD-10-CM | POA: Diagnosis not present

## 2014-06-21 DIAGNOSIS — Z79899 Other long term (current) drug therapy: Secondary | ICD-10-CM | POA: Insufficient documentation

## 2014-06-21 DIAGNOSIS — Y929 Unspecified place or not applicable: Secondary | ICD-10-CM | POA: Diagnosis not present

## 2014-06-21 DIAGNOSIS — Y9389 Activity, other specified: Secondary | ICD-10-CM | POA: Insufficient documentation

## 2014-06-21 DIAGNOSIS — D649 Anemia, unspecified: Secondary | ICD-10-CM | POA: Insufficient documentation

## 2014-06-21 DIAGNOSIS — S6992XA Unspecified injury of left wrist, hand and finger(s), initial encounter: Secondary | ICD-10-CM | POA: Insufficient documentation

## 2014-06-21 MED ORDER — TRAMADOL HCL 50 MG PO TABS: 50.0000 mg | ORAL_TABLET | Freq: Four times a day (QID) | ORAL | Status: DC | PRN

## 2014-06-21 MED ORDER — IBUPROFEN 600 MG PO TABS: 600.0000 mg | ORAL_TABLET | Freq: Four times a day (QID) | ORAL | Status: DC | PRN

## 2014-06-21 NOTE — ED Notes (Signed)
Pt c/o left fifth digit injury. Pt states she got finger caught between two doors. Pt presents with deformity to finger. No bleeding noted

## 2014-06-21 NOTE — ED Notes (Signed)
Ortho tech paged for finger splints, out of stock in fast track.

## 2014-06-21 NOTE — ED Notes (Signed)
PA at BS.  

## 2014-06-21 NOTE — ED Provider Notes (Signed)
CSN: [id number]     Arrival date & time 06/21/14  2116 History  This chart was scribed for a non-physician practitioner, Junius FinnerErin O'Malley, PA-C working with Rolan BuccoMelanie Belfi, MD by [place] Peace, ED Scribe. The patient was seen in TR11C/TR11C. The patient's care was started at 10:38 PM.   Chief Complaint  Patient presents with  . Finger Injury      The history is provided by the patient. No language interpreter was used.  HPI Comments: Candice Arellano is a 39 y.o. female who presents to the Emergency Department complaining of left pinky finger injury onset earlier today that occurred while pt was closing her closet and slammed her finger in between 2 doors. Pain is throbbing and sore, 6/10 at worst. No pain medication PTA. She notes swelling and numbness to affected finger. No bleeding. Pt is non-smoker.   Past Medical History  Diagnosis Date  . Anemia   . Abnormal Pap smear of cervix 2013    colpo was negative per patient   Past Surgical History  Procedure Laterality Date  . Cesarean section    . Tubal ligation     Family History  Problem Relation Age of Onset  . Hypertension Mother   . Hypertension Father   . Heart disease Paternal Grandmother [id number]    CABG, MI  . Cancer Neg Hx   . Stroke Neg Hx   . Depression Neg Hx   . Alcohol abuse Neg Hx   . Drug abuse Neg Hx    History  Substance Use Topics  . Smoking status: Never Smoker   . Smokeless tobacco: Never Used  . Alcohol Use: Yes     Comment: occasionally   OB History    Gravida Para Term Preterm AB TAB SAB Ectopic Multiple Living   3 3 2 1      2      Review of Systems  Musculoskeletal:       Left pinky finger injury.   Neurological: Positive for numbness.  All other systems reviewed and are negative.     Allergies  Review of patient's allergies indicates no known allergies.  Home Medications   Prior to Admission medications   Medication Sig Start Date End Date Taking? Authorizing Provider  ferrous sulfate  325 (65 FE) MG tablet Take 325 mg by mouth 2 (two) times daily.      Historical Provider, MD  fluconazole (DIFLUCAN) 150 MG tablet Take 1 tablet (150 mg total) by mouth daily. Repeat if symptoms persist in two days. 06/10/14   Tereso NewcomerUgonna A Anyanwu, MD  ibuprofen (ADVIL,MOTRIN) 200 MG tablet Take 200 mg by mouth every 6 (six) hours as needed.    Historical Provider, MD  ibuprofen (ADVIL,MOTRIN) 600 MG tablet Take 1 tablet (600 mg total) by mouth every 6 (six) hours as needed. 06/21/14   Junius FinnerErin O'Malley, PA-C  megestrol (MEGACE) 40 MG tablet Take 1 tablet (40 mg total) by mouth 2 (two) times daily. 06/03/14   Reva Boresanya S Pratt, MD  traMADol (ULTRAM) 50 MG tablet Take 1 tablet (50 mg total) by mouth every 6 (six) hours as needed. 06/21/14   Junius FinnerErin O'Malley, PA-C   BP 132/77 mmHg  Pulse 84  Temp(Src) 98.3 F (36.8 C) (Oral)  Resp 18  SpO2 96%  LMP 06/19/2014 (Exact Date) Physical Exam  Constitutional: She is oriented to person, place, and time. She appears well-developed and well-nourished.  HENT:  Head: Normocephalic and atraumatic.  Eyes: EOM are normal.  Neck: Normal range  of motion.  Cardiovascular: Normal rate.   Left little finger: cap refill <3 seconds  Pulmonary/Chest: Effort normal.  Musculoskeletal: Normal range of motion. She exhibits edema and tenderness.  Left little finger- mild deformity of distal aspect, partially flexed. Tenderness to DIP. Unable to fully straighten distal aspect of little finger. Full flexion of little finger. Able to touch thumb and little finger. Full ROM of all other fingers and wrist.   Neurological: She is alert and oriented to person, place, and time.  Left little finger: decreased sensation to light touch, sensation to sharp touch in tact  Skin: Skin is warm and dry.  Left little finger: skin in tact. No ecchymosis or erythema  Psychiatric: She has a normal mood and affect. Her behavior is normal.  Nursing note and vitals reviewed.   ED Course  Procedures  (including critical care time) Labs Review Labs Reviewed - No data to display  Imaging Review Dg Finger Little Left  06/21/2014   CLINICAL DATA:  Fifth finger caused and a closet door. Unable to straighten.  EXAM: LEFT LITTLE FINGER 2+V  COMPARISON:  None.  FINDINGS: Negative for fracture, dislocation or radiopaque foreign body. The fifth DIP is held in flexion, suggesting an extensor tendon injury.  IMPRESSION: Negative for fracture or foreign body   Electronically Signed   By: Ellery Plunk M.D.   On: 06/21/2014 23:01     EKG Interpretation None     Medications - No data to display  10:42 PM- Treatment plan was discussed with patient who verbalizes understanding and agrees.   MDM   Final diagnoses:  Injury of fifth finger, left, initial encounter    Pt presenting to ED with Left little finger injury after slamming in closet door tonight.  Mild deformity of distal aspect with decreased ability for active extension.  Concern for extensor tendon injury. Decreased sensation with light touch but sharp touch in tact. Cap refill <3 seconds. No skin injury. Plain films: negative for fracture or dislocation. Will tx as sprain and place in finger splint. Advised to call to schedule f/u appointment with Dr. Melvyn Novas, hand surgery, for further evaluation and treatment of finger injury. Home care instructions provided. Pt verbalized understanding and agreement with tx place.  I personally performed the services described in this documentation, which was scribed in my presence. The recorded information has been reviewed and is accurate.     Junius Finner, PA-C 06/21/14 1610  Rolan Bucco, MD 06/22/14 [phone]

## 2014-08-02 ENCOUNTER — Ambulatory Visit: Payer: Federal, State, Local not specified - PPO | Admitting: Family Medicine

## 2014-08-06 ENCOUNTER — Ambulatory Visit (INDEPENDENT_AMBULATORY_CARE_PROVIDER_SITE_OTHER): Payer: Federal, State, Local not specified - PPO | Admitting: Obstetrics & Gynecology

## 2014-08-06 ENCOUNTER — Encounter: Payer: Self-pay | Admitting: Obstetrics & Gynecology

## 2014-08-06 VITALS — BP 131/94 | HR 75 | Wt 246.0 lb

## 2014-08-06 DIAGNOSIS — D500 Iron deficiency anemia secondary to blood loss (chronic): Secondary | ICD-10-CM

## 2014-08-06 DIAGNOSIS — Z01812 Encounter for preprocedural laboratory examination: Secondary | ICD-10-CM | POA: Diagnosis not present

## 2014-08-06 DIAGNOSIS — Z30430 Encounter for insertion of intrauterine contraceptive device: Secondary | ICD-10-CM | POA: Diagnosis not present

## 2014-08-06 DIAGNOSIS — N920 Excessive and frequent menstruation with regular cycle: Secondary | ICD-10-CM

## 2014-08-06 LAB — POCT URINE PREGNANCY: Preg Test, Ur: NEGATIVE

## 2014-08-06 NOTE — Progress Notes (Signed)
   Subjective:    Patient ID: Candice Arellano, female    DOB: 1975/10/08, 39 y.o.   MRN: [id number]  HPI 39 yo AA lady with menorrhagia and anemia. She is here today for Mirena insertion.   Review of Systems     Objective:   Physical Exam  UPT negative, consent signed, Time out procedure done. Cervix prepped with betadine and grasped with a single tooth tenaculum. Mirena was easily placed and the strings were cut to 3-4 cm. Uterus sounded to 8 cm. She tolerated the procedure well.        Assessment & Plan:  Menorrhagia- trial of menorrhagia. Continue oral iron (liquid)

## 2014-09-06 ENCOUNTER — Ambulatory Visit (INDEPENDENT_AMBULATORY_CARE_PROVIDER_SITE_OTHER): Payer: Federal, State, Local not specified - PPO | Admitting: Family Medicine

## 2014-09-06 ENCOUNTER — Encounter: Payer: Self-pay | Admitting: Family Medicine

## 2014-09-06 VITALS — BP 131/86 | HR 77 | Wt 251.0 lb

## 2014-09-06 DIAGNOSIS — Z30431 Encounter for routine checking of intrauterine contraceptive device: Secondary | ICD-10-CM | POA: Diagnosis not present

## 2014-09-06 DIAGNOSIS — N920 Excessive and frequent menstruation with regular cycle: Secondary | ICD-10-CM | POA: Diagnosis not present

## 2014-09-06 NOTE — Assessment & Plan Note (Signed)
Improved with IUD--continue this

## 2014-09-06 NOTE — Progress Notes (Signed)
    Subjective:    Patient ID: Candice Arellano is a 39 y.o. female presenting with Contraception  on 09/06/2014  HPI: Reports she had a cycle which was light, but lasted for 10  + days. No more cramping.  Review of Systems  Constitutional: Negative for fever and chills.  Respiratory: Negative for shortness of breath.   Cardiovascular: Negative for chest pain.  Gastrointestinal: Negative for nausea, vomiting and abdominal pain.  Genitourinary: Negative for dysuria.  Skin: Negative for rash.      Objective:    BP 131/86 mmHg  Pulse 77  Wt 251 lb (113.853 kg) Physical Exam  Constitutional: She is oriented to person, place, and time. She appears well-developed and well-nourished. No distress.  HENT:  Head: Normocephalic and atraumatic.  Eyes: No scleral icterus.  Neck: Neck supple.  Cardiovascular: Normal rate.   Pulmonary/Chest: Effort normal.  Abdominal: Soft.  Genitourinary:  IUD strings noted  Neurological: She is alert and oriented to person, place, and time.  Skin: Skin is warm and dry.  Psychiatric: She has a normal mood and affect.        Assessment & Plan:   Problem List Items Addressed This Visit      Unprioritized   Menorrhagia - Primary    Improved with IUD--continue this          Return in about 3 months (around 12/07/2014).  PRATT,TANYA S 09/06/2014 4:09 PM

## 2015-03-02 ENCOUNTER — Encounter: Payer: Self-pay | Admitting: Obstetrics & Gynecology

## 2015-03-02 ENCOUNTER — Ambulatory Visit (INDEPENDENT_AMBULATORY_CARE_PROVIDER_SITE_OTHER): Payer: Federal, State, Local not specified - PPO | Admitting: Obstetrics & Gynecology

## 2015-03-02 VITALS — BP 131/87 | HR 86 | Resp 20 | Ht 63.0 in | Wt 253.0 lb

## 2015-03-02 DIAGNOSIS — Z0000 Encounter for general adult medical examination without abnormal findings: Secondary | ICD-10-CM

## 2015-03-02 DIAGNOSIS — R635 Abnormal weight gain: Secondary | ICD-10-CM

## 2015-03-02 DIAGNOSIS — Z23 Encounter for immunization: Secondary | ICD-10-CM | POA: Diagnosis not present

## 2015-03-02 DIAGNOSIS — Z862 Personal history of diseases of the blood and blood-forming organs and certain disorders involving the immune mechanism: Secondary | ICD-10-CM | POA: Diagnosis not present

## 2015-03-02 DIAGNOSIS — R51 Headache: Secondary | ICD-10-CM | POA: Diagnosis not present

## 2015-03-02 DIAGNOSIS — R519 Headache, unspecified: Secondary | ICD-10-CM

## 2015-03-02 LAB — CBC
HEMATOCRIT: 33.3 % — AB (ref 36.0–46.0)
HEMOGLOBIN: 11.2 g/dL — AB (ref 12.0–15.0)
MCH: 23.9 pg — AB (ref 26.0–34.0)
MCHC: 33.6 g/dL (ref 30.0–36.0)
MCV: 71 fL — AB (ref 78.0–100.0)
MPV: 9.2 fL (ref 8.6–12.4)
PLATELETS: 332 10*3/uL (ref 150–400)
RBC: 4.69 MIL/uL (ref 3.87–5.11)
RDW: 14.9 % (ref 11.5–15.5)
WBC: 7.3 10*3/uL (ref 4.0–10.5)

## 2015-03-02 LAB — TSH: TSH: 0.944 u[IU]/mL (ref 0.350–4.500)

## 2015-03-02 NOTE — Progress Notes (Signed)
Pt here for IUD check and has been c/o weight gain and headaches over the past few months.

## 2015-03-02 NOTE — Progress Notes (Signed)
   Subjective:    Patient ID: Candice Arellano, female    DOB: 17-Nov-1975, 39 y.o.   MRN: [id number]  HPI  39 yo AA lady is here because she has been gaining weight. She recently joined a gym. She also has chronic constipation. She never came for her IUD check so she would like that looked at.  Review of Systems     Objective:   Physical Exam [id number] Breathing, conversing, and ambulating normally Mirena strings seen (about 2 cm)       Assessment & Plan:  H/o anemia- check CBC Weight gain- I have encouraged diet/exercise. Check TSH today Preventative care- flu vaccine today Headaches- rec appt with Nada MaclachlanKaren Teague-Clark

## 2015-03-15 ENCOUNTER — Institutional Professional Consult (permissible substitution): Payer: Federal, State, Local not specified - PPO | Admitting: Physician Assistant

## 2015-05-12 ENCOUNTER — Telehealth: Payer: Self-pay | Admitting: *Deleted

## 2015-05-12 DIAGNOSIS — B379 Candidiasis, unspecified: Secondary | ICD-10-CM

## 2015-05-12 MED ORDER — FLUCONAZOLE 150 MG PO TABS: 150.0000 mg | ORAL_TABLET | Freq: Once | ORAL | Status: DC

## 2015-05-12 NOTE — Telephone Encounter (Signed)
Pt c/o white vaginal discharge with itching, sent rx for Diflucan to pharmacy per protocol.  Instructed pt on medication use.

## 2015-05-12 NOTE — Telephone Encounter (Signed)
-----   Message from Olevia Bowens sent at 05/12/2015  4:14 PM EST ----- Regarding: Rx Request Contact: [phone] Wants something for yeast Uses [facility]

## 2016-02-14 ENCOUNTER — Ambulatory Visit: Payer: Federal, State, Local not specified - PPO | Admitting: Family Medicine

## 2016-03-06 ENCOUNTER — Ambulatory Visit (INDEPENDENT_AMBULATORY_CARE_PROVIDER_SITE_OTHER): Payer: Federal, State, Local not specified - PPO | Admitting: Family Medicine

## 2016-03-06 ENCOUNTER — Encounter: Payer: Self-pay | Admitting: Family Medicine

## 2016-03-06 VITALS — BP 124/85 | HR 80 | Resp 20 | Ht 63.0 in | Wt 251.0 lb

## 2016-03-06 DIAGNOSIS — Z113 Encounter for screening for infections with a predominantly sexual mode of transmission: Secondary | ICD-10-CM

## 2016-03-06 DIAGNOSIS — Z01419 Encounter for gynecological examination (general) (routine) without abnormal findings: Secondary | ICD-10-CM | POA: Diagnosis not present

## 2016-03-06 DIAGNOSIS — Z124 Encounter for screening for malignant neoplasm of cervix: Secondary | ICD-10-CM

## 2016-03-06 LAB — CBC
HEMATOCRIT: 35.9 % (ref 35.0–45.0)
Hemoglobin: 11.8 g/dL (ref 11.7–15.5)
MCH: 24.3 pg — AB (ref 27.0–33.0)
MCHC: 32.9 g/dL (ref 32.0–36.0)
MCV: 73.9 fL — AB (ref 80.0–100.0)
MPV: 9.6 fL (ref 7.5–12.5)
PLATELETS: 348 10*3/uL (ref 140–400)
RBC: 4.86 MIL/uL (ref 3.80–5.10)
RDW: 15.4 % — AB (ref 11.0–15.0)
WBC: 7.8 10*3/uL (ref 3.8–10.8)

## 2016-03-06 LAB — COMPREHENSIVE METABOLIC PANEL
ALK PHOS: 56 U/L (ref 33–115)
ALT: 12 U/L (ref 6–29)
AST: 18 U/L (ref 10–30)
Albumin: 4.2 g/dL (ref 3.6–5.1)
BUN: 9 mg/dL (ref 7–25)
CHLORIDE: 101 mmol/L (ref 98–110)
CO2: 25 mmol/L (ref 20–31)
Calcium: 9.1 mg/dL (ref 8.6–10.2)
Creat: 0.73 mg/dL (ref 0.50–1.10)
GLUCOSE: 82 mg/dL (ref 65–99)
POTASSIUM: 3.9 mmol/L (ref 3.5–5.3)
Sodium: 137 mmol/L (ref 135–146)
Total Bilirubin: 0.4 mg/dL (ref 0.2–1.2)
Total Protein: 8.1 g/dL (ref 6.1–8.1)

## 2016-03-06 LAB — LIPID PANEL
CHOL/HDL RATIO: 3.2 ratio (ref <5.0)
Cholesterol: 165 mg/dL (ref <200)
HDL: 51 mg/dL (ref >50)
LDL Cholesterol: 94 mg/dL (ref <100)
Triglycerides: 100 mg/dL (ref <150)
VLDL: 20 mg/dL (ref <30)

## 2016-03-06 LAB — TSH: TSH: 1.09 mIU/L

## 2016-03-06 NOTE — Patient Instructions (Signed)
Preventive Care 18-39 Years, Female Preventive care refers to lifestyle choices and visits with your health care provider that can promote health and wellness. What does preventive care include?  A yearly physical exam. This is also called an annual well check.  Dental exams once or twice a year.  Routine eye exams. Ask your health care provider how often you should have your eyes checked.  Personal lifestyle choices, including:  Daily care of your teeth and gums.  Regular physical activity.  Eating a healthy diet.  Avoiding tobacco and drug use.  Limiting alcohol use.  Practicing safe sex.  Taking vitamin and mineral supplements as recommended by your health care provider. What happens during an annual well check? The services and screenings done by your health care provider during your annual well check will depend on your age, overall health, lifestyle risk factors, and family history of disease. Counseling  Your health care provider may ask you questions about your:  Alcohol use.  Tobacco use.  Drug use.  Emotional well-being.  Home and relationship well-being.  Sexual activity.  Eating habits.  Work and work environment.  Method of birth control.  Menstrual cycle.  Pregnancy history. Screening  You may have the following tests or measurements:  Height, weight, and BMI.  Diabetes screening. This is done by checking your blood sugar (glucose) after you have not eaten for a while (fasting).  Blood pressure.  Lipid and cholesterol levels. These may be checked every 5 years starting at age 20.  Skin check.  Hepatitis C blood test.  Hepatitis B blood test.  Sexually transmitted disease (STD) testing.  BRCA-related cancer screening. This may be done if you have a family history of breast, ovarian, tubal, or peritoneal cancers.  Pelvic exam and Pap test. This may be done every 3 years starting at age 21. Starting at age 30, this may be done every 5  years if you have a Pap test in combination with an HPV test. Discuss your test results, treatment options, and if necessary, the need for more tests with your health care provider. Vaccines  Your health care provider may recommend certain vaccines, such as:  Influenza vaccine. This is recommended every year.  Tetanus, diphtheria, and acellular pertussis (Tdap, Td) vaccine. You may need a Td booster every 10 years.  Varicella vaccine. You may need this if you have not been vaccinated.  HPV vaccine. If you are 26 or younger, you may need three doses over 6 months.  Measles, mumps, and rubella (MMR) vaccine. You may need at least one dose of MMR. You may also need a second dose.  Pneumococcal 13-valent conjugate (PCV13) vaccine. You may need this if you have certain conditions and were not previously vaccinated.  Pneumococcal polysaccharide (PPSV23) vaccine. You may need one or two doses if you smoke cigarettes or if you have certain conditions.  Meningococcal vaccine. One dose is recommended if you are age 19-21 years and a first-year college student living in a residence hall, or if you have one of several medical conditions. You may also need additional booster doses.  Hepatitis A vaccine. You may need this if you have certain conditions or if you travel or work in places where you may be exposed to hepatitis A.  Hepatitis B vaccine. You may need this if you have certain conditions or if you travel or work in places where you may be exposed to hepatitis B.  Haemophilus influenzae type b (Hib) vaccine. You may need this   if you have certain risk factors. Talk to your health care provider about which screenings and vaccines you need and how often you need them. This information is not intended to replace advice given to you by your health care provider. Make sure you discuss any questions you have with your health care provider. Document Released: 05/15/2001 Document Revised: 12/07/2015  Document Reviewed: 01/18/2015 Elsevier Interactive Patient Education  2017 [employer].

## 2016-03-06 NOTE — Progress Notes (Signed)
   Subjective:     Candice Arellano is a 40 y.o. female and is here for a comprehensive physical exam. The patient reports no problems. Notes lighter cycles with IUD.  Social History   Social History  . Marital status: Divorced    Spouse name: N/A  . Number of children: N/A  . Years of education: N/A   Occupational History  . unemployed    Social History Main Topics  . Smoking status: Never Smoker  . Smokeless tobacco: Never Used  . Alcohol use Yes     Comment: occasionally  . Drug use: No  . Sexual activity: Yes    Partners: Male    Birth control/ protection: Surgical, IUD     Comment: tubal ligation   Other Topics Concern  . Not on file   Social History Narrative   Lives with parents and daughter and son. In a relationship for past year and a half.  Divorced.  Unemployed.  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   Health Maintenance  Topic Date Due  . INFLUENZA VACCINE  11/01/2015  . PAP SMEAR  11/02/2016  . TETANUS/TDAP  01/11/2021  . HIV Screening  Completed    The following portions of the patient's history were reviewed and updated as appropriate: allergies, current medications, past family history, past medical history, past social history, past surgical history and problem list.  Review of Systems Pertinent items noted in HPI and remainder of comprehensive ROS otherwise negative.   Objective:    BP 124/85 (BP Location: Left Arm, Patient Position: Sitting, Cuff Size: Large)   Pulse 80   Resp 20   Ht 5\' 3"  (1.6 m)   Wt 251 lb (113.9 kg)   LMP 02/18/2016   BMI 44.46 kg/m  General appearance: alert, cooperative, appears stated age and moderately obese Head: Normocephalic, without obvious abnormality, atraumatic Neck: no adenopathy, supple, symmetrical, trachea midline and thyroid not enlarged, symmetric, no tenderness/mass/nodules Lungs: clear to  auscultation bilaterally Breasts: normal appearance, no masses or tenderness Heart: regular rate and rhythm, S1, S2 normal, no murmur, click, rub or gallop Abdomen: soft, non-tender; bowel sounds normal; no masses,  no organomegaly Pelvic: cervix normal in appearance, external genitalia normal, no adnexal masses or tenderness, no cervical motion tenderness, uterus normal size, shape, and consistency, vagina normal without discharge and IUD strings noted Extremities: extremities normal, atraumatic, no cyanosis or edema Pulses: 2+ and symmetric Skin: Skin color, texture, turgor normal. No rashes or lesions Lymph nodes: Cervical, supraclavicular, and axillary nodes normal. Neurologic: Grossly normal    Assessment:    Healthy female exam.      Plan:      Problem List Items Addressed This Visit    None    Visit Diagnoses    Encounter for gynecological examination without abnormal finding    -  Primary   Relevant Orders   Cytology - PAP   TSH   CBC   Comprehensive metabolic panel   Lipid panel   Screening for malignant neoplasm of cervix       Relevant Orders   Cytology - PAP   Screen for STD (sexually transmitted disease)       Relevant Orders   RPR   HIV antibody      See After Visit Summary for Counseling Recommendations

## 2016-03-07 LAB — RPR

## 2016-03-07 LAB — HIV ANTIBODY (ROUTINE TESTING W REFLEX): HIV 1&2 Ab, 4th Generation: NONREACTIVE

## 2016-03-12 LAB — CYTOLOGY - PAP
Chlamydia: NEGATIVE
Diagnosis: NEGATIVE
HPV 16/18/45 GENOTYPING: NEGATIVE
HPV: DETECTED — AB
NEISSERIA GONORRHEA: NEGATIVE

## 2016-03-13 ENCOUNTER — Telehealth: Payer: Self-pay | Admitting: *Deleted

## 2016-03-13 NOTE — Telephone Encounter (Signed)
-----   Message from Reva Boresanya S Pratt, MD sent at 03/13/2016  7:21 AM EST ----- Abnl pap-needs colpo

## 2016-03-13 NOTE — Telephone Encounter (Signed)
Informed pt of result, scheduled Colpo for 04-23-16 with Dr Shawnie PonsPratt.

## 2016-04-02 HISTORY — PX: LEEP: SHX91

## 2016-04-23 ENCOUNTER — Encounter: Payer: Federal, State, Local not specified - PPO | Admitting: Family Medicine

## 2016-05-15 ENCOUNTER — Encounter: Payer: Self-pay | Admitting: *Deleted

## 2016-05-15 ENCOUNTER — Encounter: Payer: Self-pay | Admitting: Family Medicine

## 2016-05-15 ENCOUNTER — Ambulatory Visit (INDEPENDENT_AMBULATORY_CARE_PROVIDER_SITE_OTHER): Payer: Federal, State, Local not specified - PPO | Admitting: Family Medicine

## 2016-05-15 VITALS — BP 127/82 | HR 69 | Resp 18 | Ht 63.0 in | Wt 251.0 lb

## 2016-05-15 DIAGNOSIS — R87810 Cervical high risk human papillomavirus (HPV) DNA test positive: Secondary | ICD-10-CM | POA: Diagnosis not present

## 2016-05-15 DIAGNOSIS — D069 Carcinoma in situ of cervix, unspecified: Secondary | ICD-10-CM | POA: Diagnosis not present

## 2016-05-15 NOTE — Assessment & Plan Note (Signed)
S/p colpo--f/u based on results.

## 2016-05-15 NOTE — Patient Instructions (Signed)
Colposcopy, Care After This sheet gives you information about how to care for yourself after your procedure. Your doctor may also give you more specific instructions. If you have problems or questions, contact your doctor. What can I expect after the procedure? If you did not have a tissue sample removed (did not have a biopsy), you may only have some spotting for a few days. You can go back to your normal activities. If you had a tissue sample removed, it is common to have:  Soreness and pain. This may last for a few days.  Light-headedness.  Mild bleeding from your vagina or dark-colored, grainy discharge from your vagina. This may last for a few days. You may need to wear a sanitary pad.  Spotting for at least 48 hours after the procedure. Follow these instructions at home:  Take over-the-counter and prescription medicines only as told by your doctor. Ask your doctor what medicines you can start taking again. This is very important if you take blood-thinning medicine.  Do not drive or use heavy machinery while taking prescription pain medicine.  For 3 days, or as long as your doctor tells you, avoid:  Douching.  Using tampons.  Having sex.  If you use birth control (contraception), keep using it.  Limit activity for the first day after the procedure. Ask your doctor what activities are safe for you.  It is up to you to get the results of your procedure. Ask your doctor when your results will be ready.  Keep all follow-up visits as told by your doctor. This is important. Contact a doctor if:  You get a skin rash. Get help right away if:  You are bleeding a lot from your vagina. It is a lot of bleeding if you are using more than one pad an hour for 2 hours in a row.  You have clumps of blood (blood clots) coming from your vagina.  You have a fever.  You have chills  You have pain in your lower belly (pelvic area).  You have signs of infection, such as vaginal  discharge that is:  Different than usual.  Yellow.  Bad-smelling.  You have very pain or cramps in your lower belly that do not get better with medicine.  You feel light-headed.  You feel dizzy.  You pass out (faint). Summary  If you did not have a tissue sample removed (did not have a biopsy), you may only have some spotting for a few days. You can go back to your normal activities.  If you had a tissue sample removed, it is common to have mild pain and spotting for 48 hours.  For 3 days, or as long as your doctor tells you, avoid douching, using tampons and having sex.  Get help right away if you have bleeding, very bad pain, or signs of infection. This information is not intended to replace advice given to you by your health care provider. Make sure you discuss any questions you have with your health care provider. Document Released: 09/05/2007 Document Revised: 12/07/2015 Document Reviewed: 12/07/2015 Elsevier Interactive Patient Education  2017 Elsevier Inc.  

## 2016-05-15 NOTE — Progress Notes (Signed)
   Subjective:    Patient ID: Candice Arellano is a 41 y.o. female presenting with Colposcopy  on 05/15/2016  HPI: Here for colposcopy. Has persistent HPV positive, though negative for HPV 16,18, 45.  Review of Systems  Constitutional: Negative for chills and fever.  Respiratory: Negative for shortness of breath.   Cardiovascular: Negative for chest pain.  Gastrointestinal: Negative for abdominal pain, nausea and vomiting.  Genitourinary: Negative for dysuria.  Skin: Negative for rash.      Objective:    BP 127/82 (BP Location: Left Arm, Patient Position: Sitting, Cuff Size: Large)   Pulse 69   Resp 18   Ht 5\' 3"  (1.6 m)   Wt 251 lb (113.9 kg)   LMP 05/11/2016   BMI 44.46 kg/m  Physical Exam  Constitutional: She is oriented to person, place, and time. She appears well-developed and well-nourished. No distress.  HENT:  Head: Normocephalic and atraumatic.  Eyes: No scleral icterus.  Neck: Neck supple.  Cardiovascular: Normal rate.   Pulmonary/Chest: Effort normal.  Abdominal: Soft.  Neurological: She is alert and oriented to person, place, and time.  Skin: Skin is warm and dry.  Psychiatric: She has a normal mood and affect.    Procedure: Patient given informed consent, signed copy in the chart, time out was performed.  Placed in lithotomy position. Cervix viewed with speculum and colposcope after application of acetic acid.   Colposcopy adequate?  yes Acetowhite lesions? yes Punctation? yes Mosaicism?  no Abnormal vasculature?  no Biopsies? 7 and 11 ECC? yes  COMMENTS:  Patient was given post procedure instructions.  She will be called with results      Assessment & Plan:   Problem List Items Addressed This Visit      Unprioritized   Cervical high risk HPV (human papillomavirus) test positive - Primary    S/p colpo--f/u based on results.      Relevant Orders   Surgical pathology     Return if symptoms worsen or fail to improve.  Reva Boresanya S  Mauro Arps 05/15/2016 2:17 PM

## 2016-05-16 ENCOUNTER — Telehealth: Payer: Self-pay | Admitting: *Deleted

## 2016-05-16 NOTE — Telephone Encounter (Signed)
Called pt, no answer, left message to call the office.  

## 2016-05-16 NOTE — Telephone Encounter (Signed)
-----   Message from Reva Boresanya S Pratt, MD sent at 05/16/2016  2:32 PM EST ----- Has CIN 3 and she needs a LEEP--please schedule

## 2016-05-29 ENCOUNTER — Ambulatory Visit (INDEPENDENT_AMBULATORY_CARE_PROVIDER_SITE_OTHER): Payer: Federal, State, Local not specified - PPO | Admitting: Family Medicine

## 2016-05-29 ENCOUNTER — Encounter: Payer: Self-pay | Admitting: Family Medicine

## 2016-05-29 VITALS — BP 123/86 | HR 80 | Resp 18 | Wt 247.0 lb

## 2016-05-29 DIAGNOSIS — Z01812 Encounter for preprocedural laboratory examination: Secondary | ICD-10-CM | POA: Diagnosis not present

## 2016-05-29 DIAGNOSIS — Z3202 Encounter for pregnancy test, result negative: Secondary | ICD-10-CM | POA: Diagnosis not present

## 2016-05-29 DIAGNOSIS — N871 Moderate cervical dysplasia: Secondary | ICD-10-CM | POA: Diagnosis not present

## 2016-05-29 DIAGNOSIS — D061 Carcinoma in situ of exocervix: Secondary | ICD-10-CM | POA: Diagnosis not present

## 2016-05-29 LAB — POCT URINE PREGNANCY: Preg Test, Ur: NEGATIVE

## 2016-05-29 NOTE — Assessment & Plan Note (Signed)
Await pathology and determine appropriate pap f/u.

## 2016-05-29 NOTE — Progress Notes (Signed)
   Subjective:    Patient ID: Candice Arellano is a 41 y.o. female presenting with LEEP  on 05/29/2016  HPI: Here today for LEEP. Pap showed normal but persistent + HPV. Colpo showed CIN3.   Review of Systems  Constitutional: Negative for chills and fever.  Gastrointestinal: Negative for abdominal pain, nausea and vomiting.      Objective:    BP 123/86 (BP Location: Left Arm, Patient Position: Sitting, Cuff Size: Large)   Pulse 80   Resp 18   Wt 247 lb (112 kg)   LMP 05/11/2016   BMI 43.75 kg/m  Physical Exam  Constitutional: She appears well-developed.  Cardiovascular: Normal rate.   Abdominal: Soft.   Procedure: Patient identified, informed consent obtained, signed copy in chart, time out performed.  Pap smear and colposcopy reviewed.   Pap negative with + HR HPV Colpo Biopsy CIN 3 ECC Neg Teflon coated speculum with smoke evacuator placed.  Cervix visualized. Paracervical block placed. Colpo performed. Medium size Fisher Cone LOOP used to remove cone of cervix using blend of cut and cautery on LEEP machine. IUD strings moved during procedure, but one string cut and other strings normal. Edges/Base cauterized with [employer].  Monsel's solution used for hemostasis.  Patient tolerated procedure well.  Patient given post procedure instructions.       Assessment & Plan:   Problem List Items Addressed This Visit      Unprioritized   Cervical intraepithelial neoplasia grade 3    Await pathology and determine appropriate pap f/u.       Other Visit Diagnoses    Pre-procedure lab exam    -  Primary   Relevant Orders   POCT urine pregnancy (Completed)      Return in about 2 weeks (around 06/12/2016) for post leep check.  Reva Boresanya S Ihan Pat 05/29/2016 2:29 PM

## 2016-05-29 NOTE — Patient Instructions (Signed)
Cervical Conization, Care After °This sheet gives you information about how to care for yourself after your procedure. Your doctor may also give you more specific instructions. If you have problems or questions, contact your doctor. °Follow these instructions at home: °Medicines °· Take over-the-counter and prescription medicines only as told by your doctor. °· Do not take aspirin until your doctor says it is okay. °· If you take pain medicine: °? You may have constipation. To help treat this, your doctor may tell you to: °§ Drink enough fluid to keep your pee (urine) clear or pale yellow. °§ Take medicines. °§ Eat foods that are high in fiber. These include fresh fruits and vegetables, whole grains, bran, and beans. °§ Limit foods that are high in fat and sugar. These include fried foods and sweet foods. °? Do not drive or use heavy machines. °General instructions °· You can eat your usual diet unless your doctor tells you not to do so. °· Take showers for the first week. Do not take baths, swim, or use hot tubs until your doctor says it is okay. °· Do not douche, use tampons, or have sex until your doctor says it is okay. °· For 7-14 days after your procedure, avoid: °? Being very active. °? Exercising. °? Heavy lifting. °· Keep all follow-up visits as told by your doctor. This is important. °Contact a doctor if: °· You have a rash. °· You are dizzy or lightheaded. °· You feel sick to your stomach (nauseous). °· You throw up (vomit). °· You have fluid from your vagina (vaginal discharge) that smells bad. °Get help right away if: °· There are blood clots coming from your vagina. °· You have more bleeding than you would have in a normal period. For example, you soak a pad in less than 1 hour. °· You have a fever. °· You have more and more cramps. °· You pass out (faint). °· You have pain when peeing. °· Your have a lot of pain. °· Your pain gets worse. °· Your pain does not get better when you take your  medicine. °· You have blood in your pee. °· You throw up (vomit). °Summary °· After your procedure, take over-the-counter and prescription medicines only as told by your doctor. °· Do not douche, use tampons, or have sex until your doctor says it is okay. °· For about 7-14 days after your procedure, try not to exercise or lift heavy objects. °· Get help right away if you have new symptoms, or if your symptoms become worse. °This information is not intended to replace advice given to you by your health care provider. Make sure you discuss any questions you have with your health care provider. °Document Released: 12/27/2007 Document Revised: 03/21/2016 Document Reviewed: 03/21/2016 °Elsevier Interactive Patient Education © 2017 Elsevier Inc. ° °

## 2016-05-29 NOTE — Addendum Note (Signed)
Addended by: Reva BoresPRATT, [place] Gavitt S on: 05/29/2016 02:36 PM   Modules accepted: Orders

## 2016-06-01 DIAGNOSIS — K080 Exfoliation of teeth due to systemic causes: Secondary | ICD-10-CM | POA: Diagnosis not present

## 2016-06-12 ENCOUNTER — Ambulatory Visit: Payer: Federal, State, Local not specified - PPO | Admitting: Family Medicine

## 2016-06-20 ENCOUNTER — Encounter: Payer: Self-pay | Admitting: Obstetrics & Gynecology

## 2016-06-20 ENCOUNTER — Ambulatory Visit (INDEPENDENT_AMBULATORY_CARE_PROVIDER_SITE_OTHER): Payer: Federal, State, Local not specified - PPO | Admitting: Obstetrics & Gynecology

## 2016-06-20 VITALS — BP 133/85 | HR 79 | Wt 247.0 lb

## 2016-06-20 DIAGNOSIS — N871 Moderate cervical dysplasia: Secondary | ICD-10-CM

## 2016-06-20 DIAGNOSIS — Z9889 Other specified postprocedural states: Secondary | ICD-10-CM

## 2016-06-20 DIAGNOSIS — B9689 Other specified bacterial agents as the cause of diseases classified elsewhere: Secondary | ICD-10-CM

## 2016-06-20 DIAGNOSIS — N760 Acute vaginitis: Secondary | ICD-10-CM

## 2016-06-20 MED ORDER — METRONIDAZOLE 500 MG PO TABS: 500.0000 mg | ORAL_TABLET | Freq: Two times a day (BID) | ORAL | 1 refills | Status: DC

## 2016-06-20 NOTE — Patient Instructions (Signed)
Return to clinic for any scheduled appointments or for any gynecologic concerns as needed.   

## 2016-06-20 NOTE — Progress Notes (Signed)
    GYNECOLOGY OFFICE VISIT NOTE  History:  41 y.o. [id number] here today for post LEEP evaluation.  Underwent LEEP on 05/29/16 for CIN 3 diagnosed on colposcopy done for persistent HPV, despite normal cytology. She reports foul-smelling abnormal vaginal discharge, thinks she has BV.  No bleeding, pelvic pain or other concerns.   Past Medical History:  Diagnosis Date  . Abnormal Pap smear of cervix 2013   colpo was negative per patient  . Anemia     Past Surgical History:  Procedure Laterality Date  . CESAREAN SECTION    . TUBAL LIGATION      The following portions of the patient's history were reviewed and updated as appropriate: allergies, current medications, past family history, past medical history, past social history, past surgical history and problem list.   Review of Systems:  Pertinent items noted in HPI and remainder of comprehensive ROS otherwise negative.   Objective:  Physical Exam BP 133/85 (BP Location: Left Arm, Patient Position: Sitting, Cuff Size: Large)   Pulse 79   Wt 247 lb (112 kg)   LMP 06/08/2016   BMI 43.75 kg/m  CONSTITUTIONAL: Well-developed, well-nourished female in no acute distress.  HENT:  Normocephalic, atraumatic. External right and left ear normal. Oropharynx is clear and moist EYES: Conjunctivae and EOM are normal. Pupils are equal, round, and reactive to light. No scleral icterus.  NECK: Normal range of motion, supple, no masses SKIN: Skin is warm and dry. No rash noted. Not diaphoretic. No erythema. No pallor. NEUROLOGIC: Alert and oriented to person, place, and time. Normal reflexes, muscle tone coordination. No cranial nerve deficit noted. PSYCHIATRIC: Normal mood and affect. Normal behavior. Normal judgment and thought content. CARDIOVASCULAR: Normal heart rate noted RESPIRATORY: Effort and breath sounds normal, no problems with respiration noted ABDOMEN: Soft, no distention noted.   PELVIC: Normal appearing external genitalia; normal  appearing vaginal mucosa and cervix; well-healed LEEP site.  Copious, foul-smelling, yellow-brown discharge noted. IUD string seen.    MUSCULOSKELETAL: Normal range of motion. No edema noted.  Labs and Imaging Cervix, LEEP Pathology - HIGH GRADE SQUAMOUS INTRAEPITHELIAL LESION, CIN-II (MODERATE DYSPLASIA). - MARGINS NOT INVOLVED.  Assessment & Plan:  1. BV (bacterial vaginosis) Patient has BV, prescribed Flagyl.  Proper vulvar hygiene emphasized: discussed avoidance of perfumed soaps, detergents, lotions and any type of douches; in addition to wearing cotton underwear and no underwear at night.  Also recommended cleaning front to back, voiding and cleaning up after intercourse.  - metroNIDAZOLE (FLAGYL) 500 MG tablet; Take 1 tablet (500 mg total) by mouth 2 (two) times daily.  Dispense: 14 tablet; Refill: 1  2. Moderate dysplasia of cervix (CIN II) 3. S/P LEEP Negative margins, patient reassured. Repeat cotesting next year.  Routine preventative health maintenance measures emphasized. Please refer to After Visit Summary for other counseling recommendations.   Return in about 1 year (around 06/20/2017) for Pap/annual exam.   Jaynie CollinsUGONNA  Kort Stettler, MD, FACOG Attending Obstetrician & Gynecologist, [facility], [facility]

## 2016-07-21 DIAGNOSIS — K080 Exfoliation of teeth due to systemic causes: Secondary | ICD-10-CM | POA: Diagnosis not present

## 2016-12-05 IMAGING — US US PELVIS COMPLETE
1 series · 13 of 25 positions shown · non-contrast
Comparison: None

CLINICAL DATA: Anemia. Abnormal Pap smear. Menorrhagia. LMP
04/19/2014.

EXAM:
TRANSABDOMINAL AND TRANSVAGINAL ULTRASOUND OF PELVIS
TECHNIQUE: Both transabdominal and transvaginal ultrasound examinations of the
pelvis were performed. Transabdominal technique was performed for
global imaging of the pelvis including uterus, ovaries, adnexal
regions, and pelvic cul-de-sac. It was necessary to proceed with
endovaginal exam following the transabdominal exam to visualize the
endometrium and ovaries.

[Series 1: us pelvis complete · 0.22mm/px · 108 acquisitions, 13 frames shown]
[im 1/108]
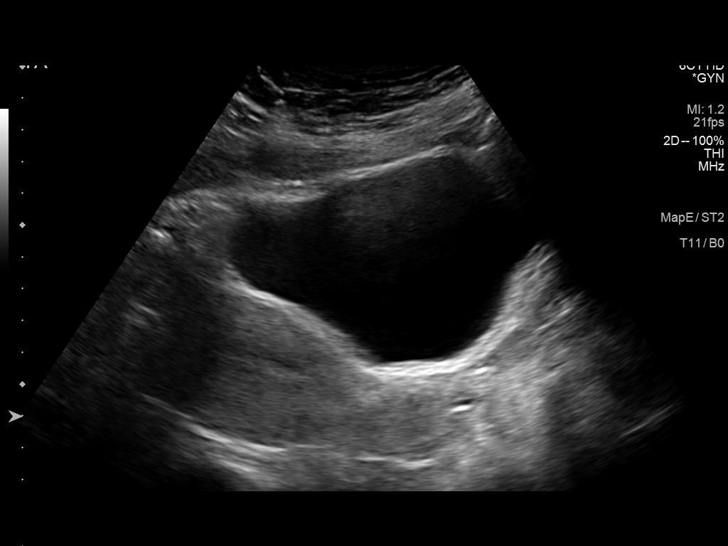
[im 9/108]
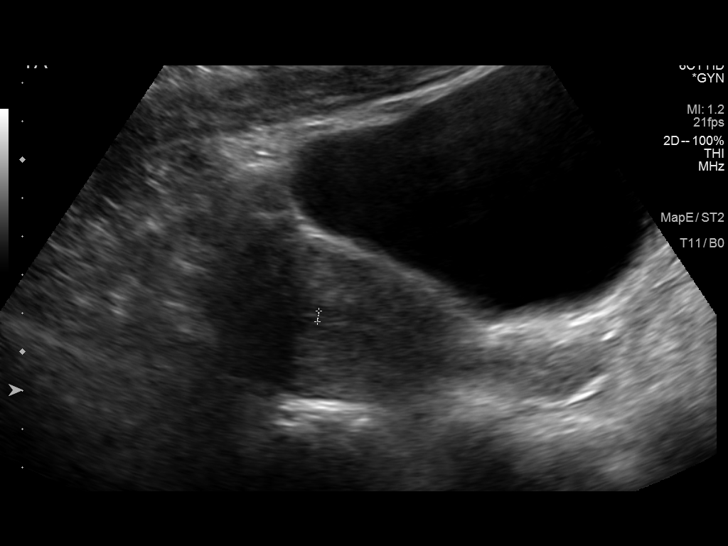
[im 18/108]
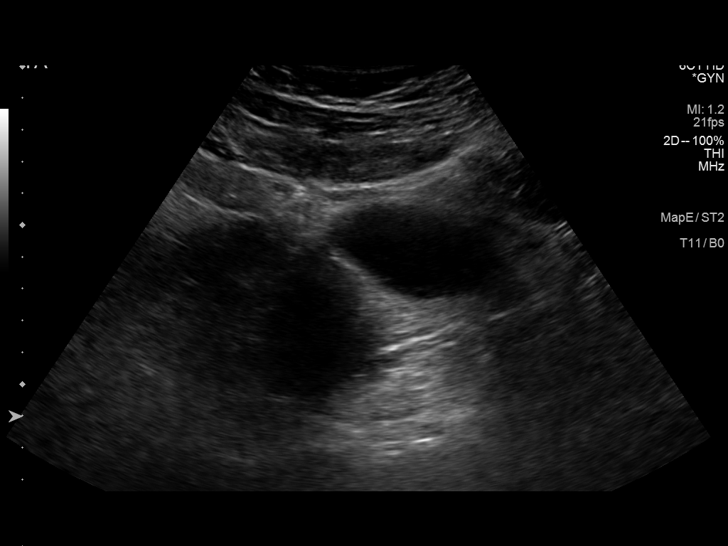
[im 27/108]
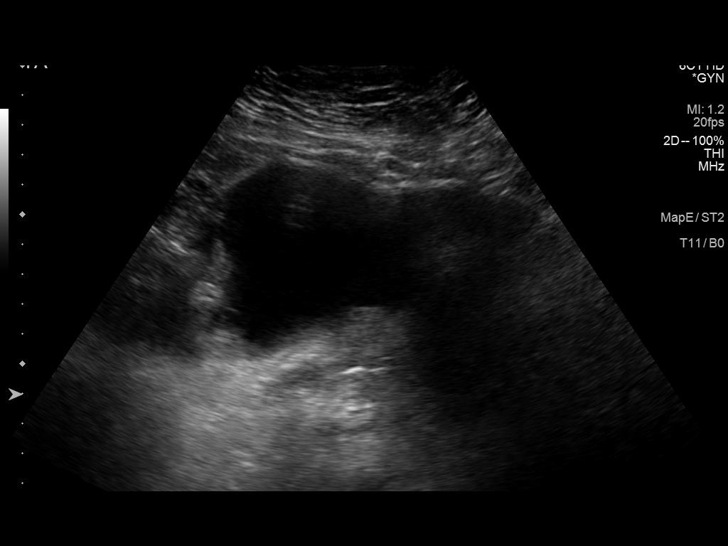
[im 36/108]
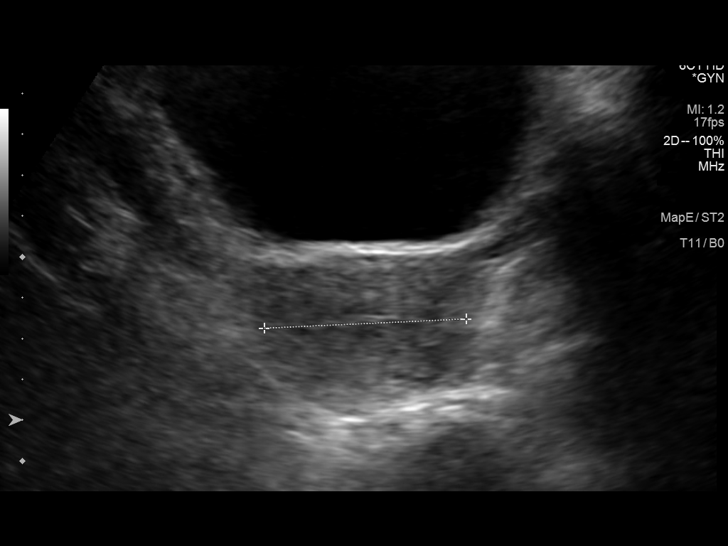
[im 45/108]
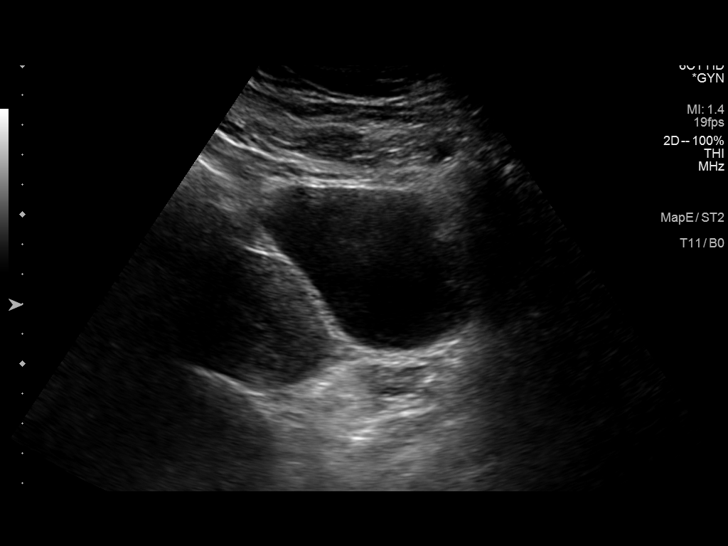
[im 54/108]
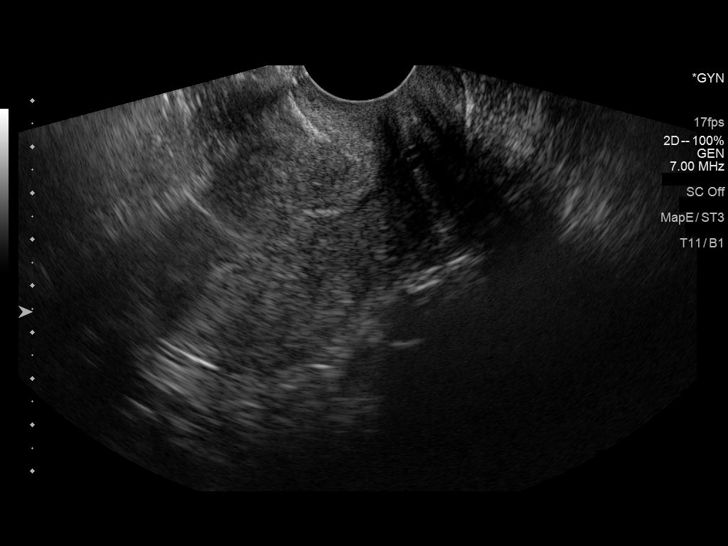
[im 63/108]
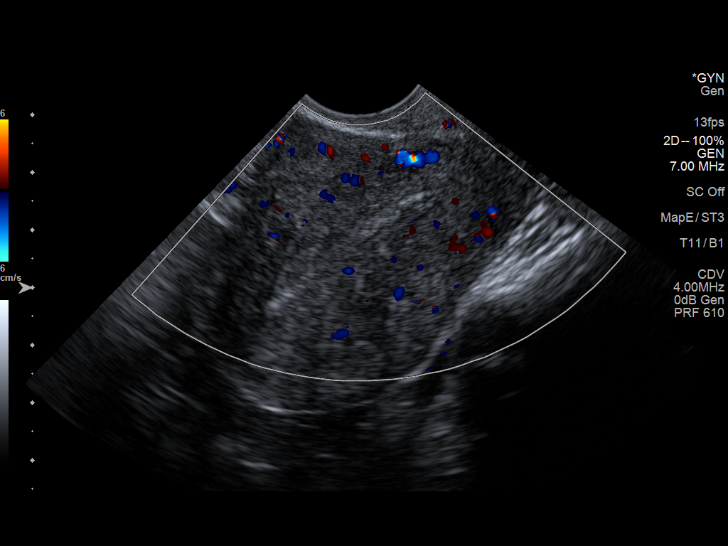
[im 72/108]
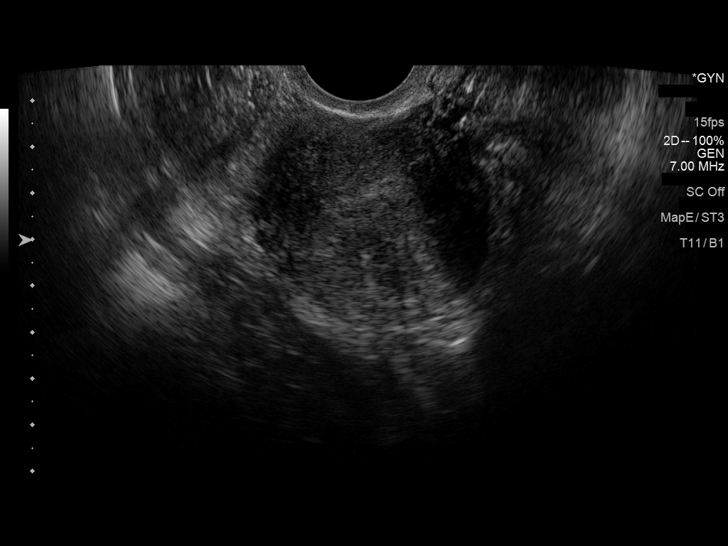
[im 81/108]
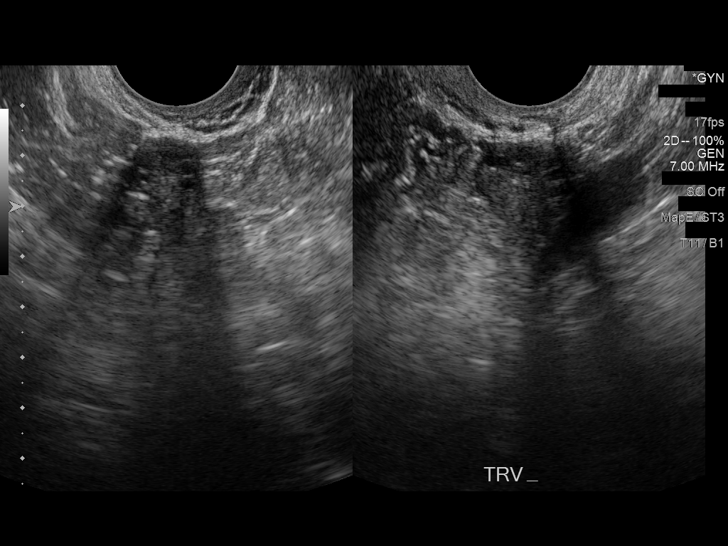
[im 90/108]
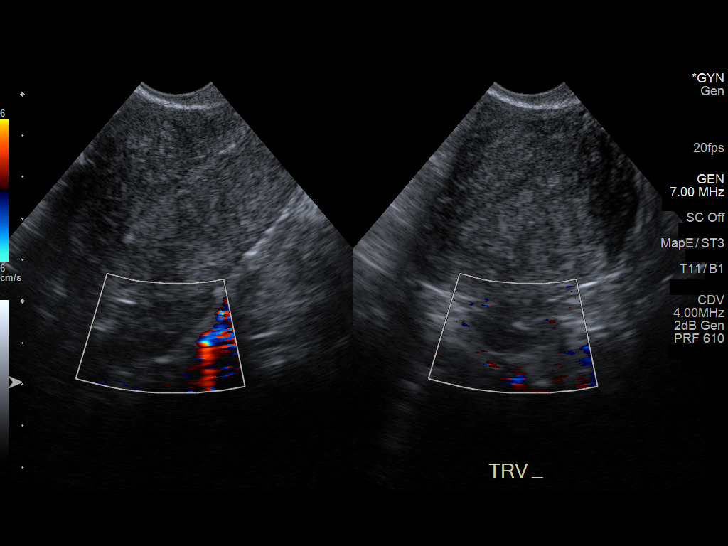
[im 99/108]
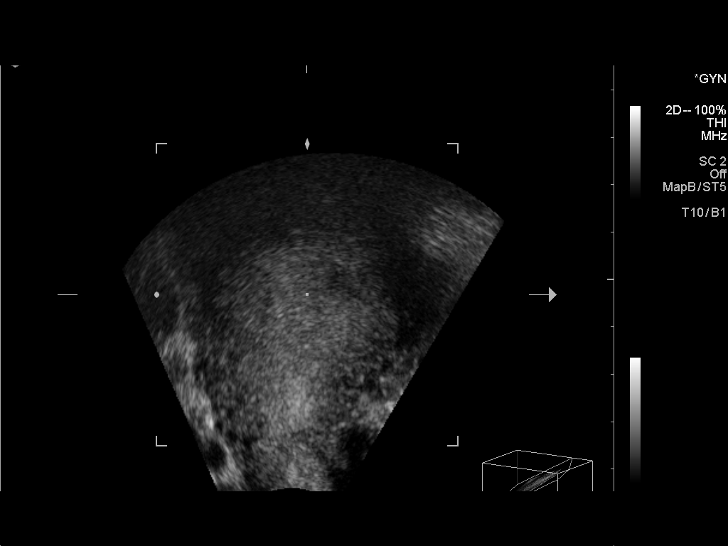
[im 108/108]
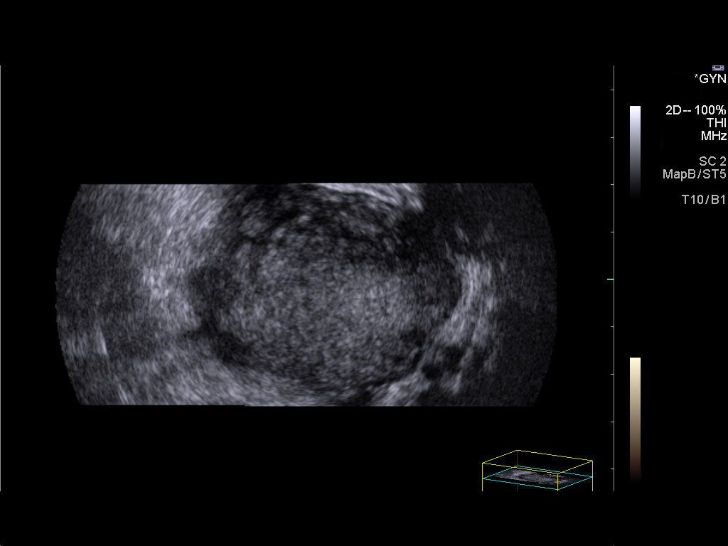

[13 of 25 positions shown; findings below may reference images not displayed]

FINDINGS: Uterus

Measurements: 7.9 x 4.3 x 4.7 cm. Probable posterior fibroid is
x 1.2 x 1.5 cm.

Endometrium

Thickness: 7.3 mm.  No focal abnormality visualized.

Right ovary

Measurements: 2.5 x 1.2 x 2.1 cm. Normal appearance/no adnexal mass.

Left ovary

Measurements: 1.8 x 1.9 x 2.1 cm. Normal appearance/no adnexal mass.

Other findings

No free fluid.
IMPRESSION: 1. Probable posterior uterine fibroid 1.8 cm.
2. Normal thickness of the endometrial stripe. If bleeding remains
unresponsive to hormonal or medical therapy, sonohysterogram should
be considered for focal lesion work-up. (Ref: Radiological
Reasoning: Algorithmic Workup of Abnormal Vaginal Bleeding with
Endovaginal Sonography and Sonohysterography. AJR [id number]; [id number])

## 2017-01-02 ENCOUNTER — Other Ambulatory Visit: Payer: Self-pay

## 2017-01-02 DIAGNOSIS — B379 Candidiasis, unspecified: Secondary | ICD-10-CM

## 2017-01-02 MED ORDER — FLUCONAZOLE 150 MG PO TABS: 150.0000 mg | ORAL_TABLET | Freq: Once | ORAL | 0 refills | Status: AC

## 2017-01-02 NOTE — Progress Notes (Unsigned)
Rx sent to pharmacy   

## 2017-01-30 ENCOUNTER — Encounter: Payer: Self-pay | Admitting: Family Medicine

## 2017-01-30 ENCOUNTER — Ambulatory Visit (INDEPENDENT_AMBULATORY_CARE_PROVIDER_SITE_OTHER): Payer: Federal, State, Local not specified - PPO | Admitting: Family Medicine

## 2017-01-30 VITALS — BP 131/80 | HR 76 | Wt 263.0 lb

## 2017-01-30 DIAGNOSIS — N898 Other specified noninflammatory disorders of vagina: Secondary | ICD-10-CM

## 2017-01-30 DIAGNOSIS — Z113 Encounter for screening for infections with a predominantly sexual mode of transmission: Secondary | ICD-10-CM

## 2017-01-30 NOTE — Progress Notes (Signed)
   CLINIC ENCOUNTER NOTE  History:  41 y.o. [id number] here today for vaginal irritation- report burning and stinging over the weekend that has continued. She denies any abnormal vaginal discharge, bleeding, pelvic pain or other concerns.   Past Medical History:  Diagnosis Date  . Abnormal Pap smear of cervix 2013   colpo was negative per patient  . Anemia     Past Surgical History:  Procedure Laterality Date  . CESAREAN SECTION    . TUBAL LIGATION      The following portions of the patient's history were reviewed and updated as appropriate: allergies, current medications, past family history, past medical history, past social history, past surgical history and problem list.   Health Maintenance:  Need pap with cotesting in Feb 2019  Review of Systems:  Pertinent items noted in HPI and remainder of comprehensive ROS otherwise negative.   Objective:  Physical Exam BP 131/80   Pulse 76   Wt 263 lb (119.3 kg)   BMI 46.59 kg/m  CONSTITUTIONAL: Obese.Well-developed, well-nourished female in no acute distress.  Cv: RR Lung: normal WOB GU: + irritation and redness at introitus. White thick discharge.   Labs and Imaging No results found.  Assessment & Plan:   1. Screening for STD (sexually transmitted disease) - HIV antibody - RPR - Cervicovaginal ancillary only  2. Vaginal irritation - Cervicovaginal ancillary only   Routine preventative health maintenance measures emphasized. Please refer to After Visit Summary for other counseling recommendations.   Return if symptoms worsen or fail to improve.

## 2017-01-31 LAB — HIV ANTIBODY (ROUTINE TESTING W REFLEX): HIV SCREEN 4TH GENERATION: NONREACTIVE

## 2017-01-31 LAB — RPR: RPR Ser Ql: NONREACTIVE

## 2017-02-01 LAB — CERVICOVAGINAL ANCILLARY ONLY
Bacterial vaginitis: POSITIVE — AB
CHLAMYDIA, DNA PROBE: NEGATIVE
Candida vaginitis: POSITIVE — AB
Neisseria Gonorrhea: NEGATIVE
Trichomonas: NEGATIVE

## 2017-02-05 ENCOUNTER — Other Ambulatory Visit: Payer: Self-pay

## 2017-02-05 MED ORDER — FLUCONAZOLE 150 MG PO TABS: 150.0000 mg | ORAL_TABLET | Freq: Once | ORAL | 0 refills | Status: AC

## 2017-02-05 MED ORDER — METRONIDAZOLE 500 MG PO TABS: 500.0000 mg | ORAL_TABLET | Freq: Two times a day (BID) | ORAL | 0 refills | Status: DC

## 2017-02-05 NOTE — Telephone Encounter (Signed)
Patient called and wanted to know her test result. Patient has yeast and BV. [facility]

## 2017-02-07 ENCOUNTER — Encounter: Payer: Self-pay | Admitting: Radiology

## 2017-04-08 ENCOUNTER — Ambulatory Visit: Payer: Federal, State, Local not specified - PPO | Admitting: Family Medicine

## 2017-05-06 ENCOUNTER — Ambulatory Visit: Payer: Federal, State, Local not specified - PPO | Admitting: Family Medicine

## 2017-05-14 ENCOUNTER — Ambulatory Visit: Payer: Federal, State, Local not specified - PPO | Admitting: Family Medicine

## 2017-05-28 ENCOUNTER — Ambulatory Visit
Admission: RE | Admit: 2017-05-28 | Discharge: 2017-05-28 | Disposition: A | Discharge status: Home / Self Care | Payer: Federal, State, Local not specified - PPO | Source: Ambulatory Visit | Attending: Family Medicine | Admitting: Family Medicine

## 2017-05-28 ENCOUNTER — Encounter: Payer: Self-pay | Admitting: Family Medicine

## 2017-05-28 ENCOUNTER — Ambulatory Visit (INDEPENDENT_AMBULATORY_CARE_PROVIDER_SITE_OTHER): Payer: Federal, State, Local not specified - PPO | Admitting: Family Medicine

## 2017-05-28 VITALS — BP 131/80 | Wt 260.0 lb

## 2017-05-28 DIAGNOSIS — Z1239 Encounter for other screening for malignant neoplasm of breast: Secondary | ICD-10-CM

## 2017-05-28 DIAGNOSIS — D061 Carcinoma in situ of exocervix: Secondary | ICD-10-CM | POA: Diagnosis not present

## 2017-05-28 DIAGNOSIS — Z1151 Encounter for screening for human papillomavirus (HPV): Secondary | ICD-10-CM

## 2017-05-28 DIAGNOSIS — Z124 Encounter for screening for malignant neoplasm of cervix: Secondary | ICD-10-CM

## 2017-05-28 DIAGNOSIS — Z1231 Encounter for screening mammogram for malignant neoplasm of breast: Secondary | ICD-10-CM | POA: Diagnosis not present

## 2017-05-28 NOTE — Assessment & Plan Note (Signed)
Pap today--f/u based on result

## 2017-05-28 NOTE — Progress Notes (Signed)
   Subjective:    Patient ID: Candice Arellano is a 42 y.o. female presenting with Follow-up (Bleeding )  on 05/28/2017  HPI: S/p LEEP in 2018 with CIN2 and negative margins. Has IUD in for bleeding which is working well. Placed in 2016.  Review of Systems  Constitutional: Negative for chills and fever.  Respiratory: Negative for shortness of breath.   Cardiovascular: Negative for chest pain.  Gastrointestinal: Negative for abdominal pain, nausea and vomiting.  Genitourinary: Negative for dysuria.  Skin: Negative for rash.      Objective:    BP 131/80   Wt 260 lb (117.9 kg)   BMI 46.06 kg/m  Physical Exam  Constitutional: She is oriented to person, place, and time. She appears well-developed and well-nourished. No distress.  HENT:  Head: Normocephalic and atraumatic.  Eyes: No scleral icterus.  Neck: Neck supple.  Cardiovascular: Normal rate.  Pulmonary/Chest: Effort normal. Right breast exhibits no inverted nipple, no mass and no nipple discharge. Left breast exhibits no inverted nipple, no mass and no nipple discharge.  Abdominal: Soft.  Genitourinary:  Genitourinary Comments: BUS normal, vagina is pink and rugated, cervix is multiparous without lesion, uterus is small and anteverted, no adnexal mass or tenderness. IUD strings noted.   Neurological: She is alert and oriented to person, place, and time.  Skin: Skin is warm and dry.  Psychiatric: She has a normal mood and affect.        Assessment & Plan:   Problem List Items Addressed This Visit      Unprioritized   Cervical intraepithelial neoplasia grade 3 - Primary    Pap today--f/u based on result      Relevant Orders   Cytology - PAP    Other Visit Diagnoses    Screening for breast cancer       Relevant Orders   MM DIGITAL SCREENING BILATERAL      Total face-to-face time with patient: 15 minutes. Over 50% of encounter was spent on counseling and coordination of care. Return in about 1 year (around  05/28/2018) for a CPE.  Reva Boresanya S Kelissa Merlin 05/28/2017 11:26 AM

## 2017-05-28 NOTE — Patient Instructions (Signed)
Preventive Care 18-39 Years, Female Preventive care refers to lifestyle choices and visits with your health care provider that can promote health and wellness. What does preventive care include?  A yearly physical exam. This is also called an annual well check.  Dental exams once or twice a year.  Routine eye exams. Ask your health care provider how often you should have your eyes checked.  Personal lifestyle choices, including: ? Daily care of your teeth and gums. ? Regular physical activity. ? Eating a healthy diet. ? Avoiding tobacco and drug use. ? Limiting alcohol use. ? Practicing safe sex. ? Taking vitamin and mineral supplements as recommended by your health care provider. What happens during an annual well check? The services and screenings done by your health care provider during your annual well check will depend on your age, overall health, lifestyle risk factors, and family history of disease. Counseling Your health care provider may ask you questions about your:  Alcohol use.  Tobacco use.  Drug use.  Emotional well-being.  Home and relationship well-being.  Sexual activity.  Eating habits.  Work and work Statistician.  Method of birth control.  Menstrual cycle.  Pregnancy history.  Screening You may have the following tests or measurements:  Height, weight, and BMI.  Diabetes screening. This is done by checking your blood sugar (glucose) after you have not eaten for a while (fasting).  Blood pressure.  Lipid and cholesterol levels. These may be checked every 5 years starting at age 66.  Skin check.  Hepatitis C blood test.  Hepatitis B blood test.  Sexually transmitted disease (STD) testing.  BRCA-related cancer screening. This may be done if you have a family history of breast, ovarian, tubal, or peritoneal cancers.  Pelvic exam and Pap test. This may be done every 3 years starting at age 40. Starting at age 59, this may be done every 5  years if you have a Pap test in combination with an HPV test.  Discuss your test results, treatment options, and if necessary, the need for more tests with your health care provider. Vaccines Your health care provider may recommend certain vaccines, such as:  Influenza vaccine. This is recommended every year.  Tetanus, diphtheria, and acellular pertussis (Tdap, Td) vaccine. You may need a Td booster every 10 years.  Varicella vaccine. You may need this if you have not been vaccinated.  HPV vaccine. If you are 69 or younger, you may need three doses over 6 months.  Measles, mumps, and rubella (MMR) vaccine. You may need at least one dose of MMR. You may also need a second dose.  Pneumococcal 13-valent conjugate (PCV13) vaccine. You may need this if you have certain conditions and were not previously vaccinated.  Pneumococcal polysaccharide (PPSV23) vaccine. You may need one or two doses if you smoke cigarettes or if you have certain conditions.  Meningococcal vaccine. One dose is recommended if you are age 27-21 years and a first-year college student living in a residence hall, or if you have one of several medical conditions. You may also need additional booster doses.  Hepatitis A vaccine. You may need this if you have certain conditions or if you travel or work in places where you may be exposed to hepatitis A.  Hepatitis B vaccine. You may need this if you have certain conditions or if you travel or work in places where you may be exposed to hepatitis B.  Haemophilus influenzae type b (Hib) vaccine. You may need this if  you have certain risk factors.  Talk to your health care provider about which screenings and vaccines you need and how often you need them. This information is not intended to replace advice given to you by your health care provider. Make sure you discuss any questions you have with your health care provider. Document Released: 05/15/2001 Document Revised: 12/07/2015  Document Reviewed: 01/18/2015 Elsevier Interactive Patient Education  [employer].

## 2017-05-30 LAB — CYTOLOGY - PAP
Diagnosis: NEGATIVE
HPV (WINDOPATH): NOT DETECTED

## 2017-08-21 DIAGNOSIS — J019 Acute sinusitis, unspecified: Secondary | ICD-10-CM | POA: Diagnosis not present

## 2017-09-11 ENCOUNTER — Other Ambulatory Visit: Payer: Self-pay

## 2017-09-11 MED ORDER — FLUCONAZOLE 150 MG PO TABS: 150.0000 mg | ORAL_TABLET | Freq: Once | ORAL | 0 refills | Status: AC

## 2017-09-11 NOTE — Telephone Encounter (Signed)
Patient is requesting [facility] on [facility]

## 2018-05-19 ENCOUNTER — Ambulatory Visit: Payer: Federal, State, Local not specified - PPO | Admitting: Nurse Practitioner

## 2018-05-19 ENCOUNTER — Encounter: Payer: Self-pay | Admitting: Nurse Practitioner

## 2018-05-19 VITALS — BP 122/88 | HR 77 | Temp 98.1°F | Ht 63.0 in | Wt 272.8 lb

## 2018-05-19 DIAGNOSIS — Z13220 Encounter for screening for lipoid disorders: Secondary | ICD-10-CM | POA: Diagnosis not present

## 2018-05-19 DIAGNOSIS — Z0001 Encounter for general adult medical examination with abnormal findings: Secondary | ICD-10-CM | POA: Diagnosis not present

## 2018-05-19 DIAGNOSIS — Z136 Encounter for screening for cardiovascular disorders: Secondary | ICD-10-CM | POA: Diagnosis not present

## 2018-05-19 DIAGNOSIS — D500 Iron deficiency anemia secondary to blood loss (chronic): Secondary | ICD-10-CM

## 2018-05-19 LAB — LIPID PANEL
Cholesterol: 150 mg/dL (ref 0–200)
HDL: 54.2 mg/dL (ref >39.00)
LDL CALC: 84 mg/dL (ref 0–99)
NonHDL: 95.63
Total CHOL/HDL Ratio: 3
Triglycerides: 60 mg/dL (ref 0.0–149.0)
VLDL: 12 mg/dL (ref 0.0–40.0)

## 2018-05-19 LAB — COMPREHENSIVE METABOLIC PANEL
ALK PHOS: 55 U/L (ref 39–117)
ALT: 11 U/L (ref 0–35)
AST: 15 U/L (ref 0–37)
Albumin: 3.6 g/dL (ref 3.5–5.2)
BUN: 13 mg/dL (ref 6–23)
CALCIUM: 8.7 mg/dL (ref 8.4–10.5)
CO2: 25 meq/L (ref 19–32)
CREATININE: 0.71 mg/dL (ref 0.40–1.20)
Chloride: 106 mEq/L (ref 96–112)
GFR: 109.76 mL/min (ref >60.00)
Glucose, Bld: 84 mg/dL (ref 70–99)
Potassium: 4.4 mEq/L (ref 3.5–5.1)
Sodium: 138 mEq/L (ref 135–145)
TOTAL PROTEIN: 6.6 g/dL (ref 6.0–8.3)
Total Bilirubin: 0.3 mg/dL (ref 0.2–1.2)

## 2018-05-19 LAB — CBC
HCT: 47 % — ABNORMAL HIGH (ref 36.0–46.0)
HEMOGLOBIN: 16.2 g/dL — AB (ref 12.0–15.0)
MCHC: 34.5 g/dL (ref 30.0–36.0)
MCV: 86.2 fl (ref 78.0–100.0)
Platelets: 223 10*3/uL (ref 150.0–400.0)
RBC: 5.46 Mil/uL — AB (ref 3.87–5.11)
RDW: 13.5 % (ref 11.5–15.5)
WBC: 5.5 10*3/uL (ref 4.0–10.5)

## 2018-05-19 NOTE — Patient Instructions (Signed)
Go to lab for blood draw  Health Maintenance, Female Adopting a healthy lifestyle and getting preventive care can go a long way to promote health and wellness. Talk with your health care provider about what schedule of regular examinations is right for you. This is a good chance for you to check in with your provider about disease prevention and staying healthy. In between checkups, there are plenty of things you can do on your own. Experts have done a lot of research about which lifestyle changes and preventive measures are most likely to keep you healthy. Ask your health care provider for more information. Weight and diet Eat a healthy diet  Be sure to include plenty of vegetables, fruits, low-fat dairy products, and lean protein.  Do not eat a lot of foods high in solid fats, added sugars, or salt.  Get regular exercise. This is one of the most important things you can do for your health. ? Most adults should exercise for at least 150 minutes each week. The exercise should increase your heart rate and make you sweat (moderate-intensity exercise). ? Most adults should also do strengthening exercises at least twice a week. This is in addition to the moderate-intensity exercise. Maintain a healthy weight  Body mass index (BMI) is a measurement that can be used to identify possible weight problems. It estimates body fat based on height and weight. Your health care provider can help determine your BMI and help you achieve or maintain a healthy weight.  For females 20 years of age and older: ? A BMI below 18.5 is considered underweight. ? A BMI of 18.5 to 24.9 is normal. ? A BMI of 25 to 29.9 is considered overweight. ? A BMI of 30 and above is considered obese. Watch levels of cholesterol and blood lipids  You should start having your blood tested for lipids and cholesterol at 41 years of age, then have this test every 5 years.  You may need to have your cholesterol levels checked more  often if: ? Your lipid or cholesterol levels are high. ? You are older than 41 years of age. ? You are at high risk for heart disease. Cancer screening Lung Cancer  Lung cancer screening is recommended for adults 55-80 years old who are at high risk for lung cancer because of a history of smoking.  A yearly low-dose CT scan of the lungs is recommended for people who: ? Currently smoke. ? Have quit within the past 15 years. ? Have at least a 30-pack-year history of smoking. A pack year is smoking an average of one pack of cigarettes a day for 1 year.  Yearly screening should continue until it has been 15 years since you quit.  Yearly screening should stop if you develop a health problem that would prevent you from having lung cancer treatment. Breast Cancer  Practice breast self-awareness. This means understanding how your breasts normally appear and feel.  It also means doing regular breast self-exams. Let your health care provider know about any changes, no matter how small.  If you are in your 20s or 30s, you should have a clinical breast exam (CBE) by a health care provider every 1-3 years as part of a regular health exam.  If you are 40 or older, have a CBE every year. Also consider having a breast X-ray (mammogram) every year.  If you have a family history of breast cancer, talk to your health care provider about genetic screening.  If you are   at high risk for breast cancer, talk to your health care provider about having an MRI and a mammogram every year.  Breast cancer gene (BRCA) assessment is recommended for women who have family members with BRCA-related cancers. BRCA-related cancers include: ? Breast. ? Ovarian. ? Tubal. ? Peritoneal cancers.  Results of the assessment will determine the need for genetic counseling and BRCA1 and BRCA2 testing. Cervical Cancer Your health care provider may recommend that you be screened regularly for cancer of the pelvic organs  (ovaries, uterus, and vagina). This screening involves a pelvic examination, including checking for microscopic changes to the surface of your cervix (Pap test). You may be encouraged to have this screening done every 3 years, beginning at age 21.  For women ages 30-65, health care providers may recommend pelvic exams and Pap testing every 3 years, or they may recommend the Pap and pelvic exam, combined with testing for human papilloma virus (HPV), every 5 years. Some types of HPV increase your risk of cervical cancer. Testing for HPV may also be done on women of any age with unclear Pap test results.  Other health care providers may not recommend any screening for nonpregnant women who are considered low risk for pelvic cancer and who do not have symptoms. Ask your health care provider if a screening pelvic exam is right for you.  If you have had past treatment for cervical cancer or a condition that could lead to cancer, you need Pap tests and screening for cancer for at least 20 years after your treatment. If Pap tests have been discontinued, your risk factors (such as having a new sexual partner) need to be reassessed to determine if screening should resume. Some women have medical problems that increase the chance of getting cervical cancer. In these cases, your health care provider may recommend more frequent screening and Pap tests. Colorectal Cancer  This type of cancer can be detected and often prevented.  Routine colorectal cancer screening usually begins at 41 years of age and continues through 41 years of age.  Your health care provider may recommend screening at an earlier age if you have risk factors for colon cancer.  Your health care provider may also recommend using home test kits to check for hidden blood in the stool.  A small camera at the end of a tube can be used to examine your colon directly (sigmoidoscopy or colonoscopy). This is done to check for the earliest forms of  colorectal cancer.  Routine screening usually begins at age 50.  Direct examination of the colon should be repeated every 5-10 years through 41 years of age. However, you may need to be screened more often if early forms of precancerous polyps or small growths are found. Skin Cancer  Check your skin from head to toe regularly.  Tell your health care provider about any new moles or changes in moles, especially if there is a change in a mole's shape or color.  Also tell your health care provider if you have a mole that is larger than the size of a pencil eraser.  Always use sunscreen. Apply sunscreen liberally and repeatedly throughout the day.  Protect yourself by wearing long sleeves, pants, a wide-brimmed hat, and sunglasses whenever you are outside. Heart disease, diabetes, and high blood pressure  High blood pressure causes heart disease and increases the risk of stroke. High blood pressure is more likely to develop in: ? People who have blood pressure in the high end of the   normal range (130-139/85-89 mm Hg). ? People who are overweight or obese. ? People who are African American.  If you are 18-39 years of age, have your blood pressure checked every 3-5 years. If you are 40 years of age or older, have your blood pressure checked every year. You should have your blood pressure measured twice-once when you are at a hospital or clinic, and once when you are not at a hospital or clinic. Record the average of the two measurements. To check your blood pressure when you are not at a hospital or clinic, you can use: ? An automated blood pressure machine at a pharmacy. ? A home blood pressure monitor.  If you are between 55 years and 79 years old, ask your health care provider if you should take aspirin to prevent strokes.  Have regular diabetes screenings. This involves taking a blood sample to check your fasting blood sugar level. ? If you are at a normal weight and have a low risk for  diabetes, have this test once every three years after 41 years of age. ? If you are overweight and have a high risk for diabetes, consider being tested at a younger age or more often. Preventing infection Hepatitis B  If you have a higher risk for hepatitis B, you should be screened for this virus. You are considered at high risk for hepatitis B if: ? You were born in a country where hepatitis B is common. Ask your health care provider which countries are considered high risk. ? Your parents were born in a high-risk country, and you have not been immunized against hepatitis B (hepatitis B vaccine). ? You have HIV or AIDS. ? You use needles to inject street drugs. ? You live with someone who has hepatitis B. ? You have had sex with someone who has hepatitis B. ? You get hemodialysis treatment. ? You take certain medicines for conditions, including cancer, organ transplantation, and autoimmune conditions. Hepatitis C  Blood testing is recommended for: ? Everyone born from 1945 through 1965. ? Anyone with known risk factors for hepatitis C. Sexually transmitted infections (STIs)  You should be screened for sexually transmitted infections (STIs) including gonorrhea and chlamydia if: ? You are sexually active and are younger than 41 years of age. ? You are older than 41 years of age and your health care provider tells you that you are at risk for this type of infection. ? Your sexual activity has changed since you were last screened and you are at an increased risk for chlamydia or gonorrhea. Ask your health care provider if you are at risk.  If you do not have HIV, but are at risk, it may be recommended that you take a prescription medicine daily to prevent HIV infection. This is called pre-exposure prophylaxis (PrEP). You are considered at risk if: ? You are sexually active and do not regularly use condoms or know the HIV status of your partner(s). ? You take drugs by injection. ? You are  sexually active with a partner who has HIV. Talk with your health care provider about whether you are at high risk of being infected with HIV. If you choose to begin PrEP, you should first be tested for HIV. You should then be tested every 3 months for as long as you are taking PrEP. Pregnancy  If you are premenopausal and you may become pregnant, ask your health care provider about preconception counseling.  If you may become pregnant, take 400 to   800 micrograms (mcg) of folic acid every day.  If you want to prevent pregnancy, talk to your health care provider about birth control (contraception). Osteoporosis and menopause  Osteoporosis is a disease in which the bones lose minerals and strength with aging. This can result in serious bone fractures. Your risk for osteoporosis can be identified using a bone density scan.  If you are 51 years of age or older, or if you are at risk for osteoporosis and fractures, ask your health care provider if you should be screened.  Ask your health care provider whether you should take a calcium or vitamin D supplement to lower your risk for osteoporosis.  Menopause may have certain physical symptoms and risks.  Hormone replacement therapy may reduce some of these symptoms and risks. Talk to your health care provider about whether hormone replacement therapy is right for you. Follow these instructions at home:  Schedule regular health, dental, and eye exams.  Stay current with your immunizations.  Do not use any tobacco products including cigarettes, chewing tobacco, or electronic cigarettes.  If you are pregnant, do not drink alcohol.  If you are breastfeeding, limit how much and how often you drink alcohol.  Limit alcohol intake to no more than 1 drink per day for nonpregnant women. One drink equals 12 ounces of beer, 5 ounces of wine, or 1 ounces of hard liquor.  Do not use street drugs.  Do not share needles.  Ask your health care  provider for help if you need support or information about quitting drugs.  Tell your health care provider if you often feel depressed.  Tell your health care provider if you have ever been abused or do not feel safe at home. This information is not intended to replace advice given to you by your health care provider. Make sure you discuss any questions you have with your health care provider. Document Released: 10/02/2010 Document Revised: 08/25/2015 Document Reviewed: 12/21/2014 Elsevier Interactive Patient Education  2019 [employer].

## 2018-05-19 NOTE — Progress Notes (Signed)
Subjective:    Patient ID: Candice Arellano, female    DOB: Nov 18, 1975, 43 y.o.   MRN: [id number]  Patient presents today for complete physical   HPI  denies any acute complains.  Sexual History (orientation,birth control, marital status, STD):sexualy active, female partner, no condom. Pelvic and breast exam done by Dr. Shawnie PonsPratt Hx. Of menorrhagia: improved with mirena IUD. Inserted 2016  Depression/Suicide: Depression screen PHQ 2/9 05/19/2018  Decreased Interest 0  Down, Depressed, Hopeless 0  PHQ - 2 Score 0   Vision:up to date, use of corrective lens  Dental:up to date, done every 6months  Immunizations: (TDAP, Hep C screen, Pneumovax, Influenza, zoster)  Health Maintenance  Topic Date Due  . Flu Shot  07/01/2018*  . Pap Smear  05/28/2020  . Tetanus Vaccine  01/11/2021  . HIV Screening  Completed  *Topic was postponed. The date shown is not the original due date.   Diet:regular.  Weight:  Wt Readings from Last 3 Encounters:  05/19/18 272 lb 12.8 oz (123.7 kg)  05/28/17 260 lb (117.9 kg)  01/30/17 263 lb (119.3 kg)   Exercise:occassional walking  Fall Risk: Fall Risk  05/19/2018  Falls in the past year? 0   Advanced Directive: Advanced Directives 06/21/2014  Does Patient Have a Medical Advance Directive? No  Would patient like information on creating a medical advance directive? No - patient declined information     Medications and allergies reviewed with patient and updated if appropriate.  Patient Active Problem List   Diagnosis Date Noted  . Sciatica 06/03/2014  . Cervical intraepithelial neoplasia grade 3 11/07/2013  . Anemia 10/18/2010  . Obesity 10/18/2010  . Menorrhagia 10/18/2010    Current Outpatient Medications on File Prior to Visit  Medication Sig Dispense Refill  . ibuprofen (ADVIL,MOTRIN) 600 MG tablet Take 1 tablet (600 mg total) by mouth every 6 (six) hours as needed. (Patient not taking: Reported on 01/30/2017) 30 tablet 0  .  Lactobacillus (PROBIOTIC ACIDOPHILUS PO) Take 1 tablet by mouth daily.    Marland Kitchen. levonorgestrel (MIRENA) 20 MCG/24HR IUD 1 each by Intrauterine route once.    . metroNIDAZOLE (FLAGYL) 500 MG tablet Take 1 tablet (500 mg total) by mouth 2 (two) times daily. (Patient not taking: Reported on 01/30/2017) 14 tablet 1  . metroNIDAZOLE (FLAGYL) 500 MG tablet Take 1 tablet (500 mg total) 2 (two) times daily by mouth. (Patient not taking: Reported on 05/28/2017) 14 tablet 0   No current facility-administered medications on file prior to visit.     Past Medical History:  Diagnosis Date  . Abnormal Pap smear of cervix 2013   colpo was negative per patient  . Allergy   . Anemia   . Chicken pox   . Depression   . Headache   . Hypertension   . UTI (urinary tract infection)     Past Surgical History:  Procedure Laterality Date  . CESAREAN SECTION  2004  . LEEP    . TENDON REPAIR Left 1992   left thumb  . TUBAL LIGATION      Social History   Socioeconomic History  . Marital status: Divorced    Spouse name: Not on file  . Number of children: 2  . Years of education: Not on file  . Highest education level: Not on file  Occupational History  . Occupation: unemployed  Social Needs  . Financial resource strain: Not on file  . Food insecurity:    Worry: Not on file  Inability: Not on file  . Transportation needs:    Medical: Not on file    Non-medical: Not on file  Tobacco Use  . Smoking status: Never Smoker  . Smokeless tobacco: Never Used  Substance and Sexual Activity  . Alcohol use: Yes    Alcohol/week: 1.0 standard drinks    Types: 1 Glasses of wine per week    Comment: occasionally  . Drug use: No  . Sexual activity: Yes    Partners: Male    Birth control/protection: Surgical, I.U.D.    Comment: tubal ligation, IUD inserted 2016  Lifestyle  . Physical activity:    Days per week: Not on file    Minutes per session: Not on file  . Stress: Not on file  Relationships  .  Social connections:    Talks on phone: Not on file    Gets together: Not on file    Attends religious service: Not on file    Active member of club or organization: Not on file    Attends meetings of clubs or organizations: Not on file    Relationship status: Not on file  Other Topics Concern  . Not on file  Social History Narrative   Lives with parents and daughter and son. In a relationship for past year and a half.  Divorced.  Unemployed.  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    Family History  Problem Relation Age of Onset  . Hypertension Mother   . Arthritis Mother        OA  . Hypertension Father   . Asthma Father   . Diabetes Father   . Heart disease Paternal Grandmother 32       CABG, MI  . Arthritis Paternal Grandmother   . Asthma Daughter   . Arthritis Maternal Grandmother   . Diabetes Maternal Grandmother   . Hypertension Maternal Grandmother   . Kidney disease Maternal Grandfather        unknown cause  . Heart attack Paternal Grandfather 36       MI cause of death  . Cancer Neg Hx   . Stroke Neg Hx   . Depression Neg Hx   . Alcohol abuse Neg Hx   . Drug abuse Neg Hx   . Breast cancer Neg Hx         Review of Systems  Constitutional: Negative for fever, malaise/fatigue and weight loss.  HENT: Negative for congestion and sore throat.   Eyes:       Negative for visual changes  Respiratory: Negative for cough and shortness of breath.   Cardiovascular: Negative for chest pain, palpitations and leg swelling.  Gastrointestinal: Negative for blood in stool, constipation, diarrhea and heartburn.  Genitourinary: Negative for dysuria, frequency and urgency.  Musculoskeletal: Negative for falls, joint pain and myalgias.  Skin: Negative for rash.  Neurological: Negative for dizziness, sensory change and headaches.  Endo/Heme/Allergies: Does not bruise/bleed  easily.  Psychiatric/Behavioral: Negative for depression, substance abuse and suicidal ideas. The patient is not nervous/anxious.     Objective:   Vitals:   05/19/18 1321  BP: 122/88  Pulse: 77  Temp: 98.1 F (36.7 C)  SpO2: 98%   Body mass index is 48.32 kg/m.   Physical Examination:  Physical Exam Vitals signs reviewed.  Constitutional:      Appearance: She is obese.  HENT:  Right Ear: Tympanic membrane, ear canal and external ear normal.     Left Ear: Tympanic membrane, ear canal and external ear normal.     Nose: Nose normal.     Mouth/Throat:     Mouth: Mucous membranes are moist.  Eyes:     Extraocular Movements: Extraocular movements intact.     Conjunctiva/sclera: Conjunctivae normal.     Pupils: Pupils are equal, round, and reactive to light.  Neck:     Musculoskeletal: Normal range of motion and neck supple.     Thyroid: No thyroid mass, thyromegaly or thyroid tenderness.  Cardiovascular:     Rate and Rhythm: Normal rate and regular rhythm.     Pulses: Normal pulses.     Heart sounds: Normal heart sounds.  Pulmonary:     Effort: Pulmonary effort is normal.     Breath sounds: Normal breath sounds.  Abdominal:     General: Bowel sounds are normal.     Palpations: Abdomen is soft.  Musculoskeletal:     Right lower leg: No edema.     Left lower leg: No edema.  Lymphadenopathy:     Cervical: No cervical adenopathy.  Neurological:     Mental Status: She is alert and oriented to person, place, and time.  Psychiatric:        Mood and Affect: Mood normal.        Behavior: Behavior normal.        Thought Content: Thought content normal.        Judgment: Judgment normal.     ASSESSMENT and PLAN:  Tongela was seen today for establish care.  Diagnoses and all orders for this visit:  Encounter for preventative adult health care exam with abnormal findings -     CBC -     Comprehensive metabolic panel -     TSH -     Lipid panel  Iron  deficiency anemia due to chronic blood loss -     Iron, TIBC and Ferritin Panel  Encounter for lipid screening for cardiovascular disease -     Lipid panel   No problem-specific Assessment & Plan notes found for this encounter.     Problem List Items Addressed This Visit      Other   Anemia   Relevant Orders   Iron, TIBC and Ferritin Panel    Other Visit Diagnoses    Encounter for preventative adult health care exam with abnormal findings    -  Primary   Relevant Orders   CBC   Comprehensive metabolic panel   TSH   Lipid panel   Encounter for lipid screening for cardiovascular disease       Relevant Orders   Lipid panel       Follow up: Return if symptoms worsen or fail to improve.  Alysia Penna, NP

## 2018-05-20 LAB — IRON,TIBC AND FERRITIN PANEL
%SAT: 26 % (calc) (ref 16–45)
Ferritin: 23 ng/mL (ref 16–232)
Iron: 96 ug/dL (ref 40–190)
TIBC: 373 mcg/dL (calc) (ref 250–450)

## 2018-05-21 LAB — TSH: TSH: 1.65 u[IU]/mL (ref 0.35–4.50)

## 2018-05-22 NOTE — Addendum Note (Signed)
Addended by: Michaela Corner on: 05/22/2018 03:38 PM   Modules accepted: Orders

## 2018-06-05 ENCOUNTER — Other Ambulatory Visit: Payer: Self-pay | Admitting: Family Medicine

## 2018-06-05 ENCOUNTER — Ambulatory Visit (INDEPENDENT_AMBULATORY_CARE_PROVIDER_SITE_OTHER): Payer: Federal, State, Local not specified - PPO | Admitting: Family Medicine

## 2018-06-05 ENCOUNTER — Encounter: Payer: Self-pay | Admitting: Family Medicine

## 2018-06-05 VITALS — BP 147/97 | HR 72 | Wt 273.2 lb

## 2018-06-05 DIAGNOSIS — Z01419 Encounter for gynecological examination (general) (routine) without abnormal findings: Secondary | ICD-10-CM | POA: Diagnosis not present

## 2018-06-05 DIAGNOSIS — B3731 Acute candidiasis of vulva and vagina: Secondary | ICD-10-CM

## 2018-06-05 DIAGNOSIS — Z1231 Encounter for screening mammogram for malignant neoplasm of breast: Secondary | ICD-10-CM

## 2018-06-05 DIAGNOSIS — B373 Candidiasis of vulva and vagina: Secondary | ICD-10-CM

## 2018-06-05 MED ORDER — FLUCONAZOLE 150 MG PO TABS: 150.0000 mg | ORAL_TABLET | Freq: Every day | ORAL | 2 refills | Status: DC

## 2018-06-05 NOTE — Progress Notes (Signed)
Last pap 02/29/2019- normal Mammogram normal -2019 Discuss weight gain

## 2018-06-05 NOTE — Progress Notes (Signed)
  Subjective:     Candice Arellano is a 43 y.o. female and is here for a comprehensive physical exam. The patient reports problems - weight gain. thinks this might be related to IUD. No bleeding. S/p BTL.  The following portions of the patient's history were reviewed and updated as appropriate: allergies, current medications, past family history, past medical history, past social history, past surgical history and problem list.  Review of Systems Pertinent items noted in HPI and remainder of comprehensive ROS otherwise negative.   Objective:    BP (!) 147/97   Pulse 72   Wt 273 lb 3.2 oz (123.9 kg)   LMP 04/16/2018   BMI 48.40 kg/m  General appearance: alert, cooperative, appears stated age and moderately obese Head: Normocephalic, without obvious abnormality, atraumatic Neck: no adenopathy, supple, symmetrical, trachea midline and thyroid not enlarged, symmetric, no tenderness/mass/nodules Lungs: clear to auscultation bilaterally Breasts: normal appearance, no masses or tenderness Heart: regular rate and rhythm, S1, S2 normal, no murmur, click, rub or gallop Abdomen: soft, non-tender; bowel sounds normal; no masses,  no organomegaly Pelvic: cervix normal in appearance, external genitalia normal, no adnexal masses or tenderness, no cervical motion tenderness, uterus normal size, shape, and consistency and IUD strings noted, clumpy discharge Extremities: Homans sign is negative, no sign of DVT 2+ edema Pulses: 2+ and symmetric Skin: Skin color, texture, turgor normal. No rashes or lesions Lymph nodes: Cervical, supraclavicular, and axillary nodes normal. Neurologic: Grossly normal    Assessment:    Healthy female exam.      Plan:    continue IUD Encounter for gynecological examination without abnormal finding - Plan: CANCELED: MM DIGITAL SCREENING BILATERAL, CANCELED: MM 3D SCREEN BREAST BILATERAL  Yeast vaginitis - Plan: fluconazole (DIFLUCAN) 150 MG tablet  Return in 1  year (on 06/05/2019).  See After Visit Summary for Counseling Recommendations

## 2018-06-05 NOTE — Patient Instructions (Signed)
 Preventive Care 18-39 Years, Female Preventive care refers to lifestyle choices and visits with your health care provider that can promote health and wellness. What does preventive care include?   A yearly physical exam. This is also called an annual well check.  Dental exams once or twice a year.  Routine eye exams. Ask your health care provider how often you should have your eyes checked.  Personal lifestyle choices, including: ? Daily care of your teeth and gums. ? Regular physical activity. ? Eating a healthy diet. ? Avoiding tobacco and drug use. ? Limiting alcohol use. ? Practicing safe sex. ? Taking vitamin and mineral supplements as recommended by your health care provider. What happens during an annual well check? The services and screenings done by your health care provider during your annual well check will depend on your age, overall health, lifestyle risk factors, and family history of disease. Counseling Your health care provider may ask you questions about your:  Alcohol use.  Tobacco use.  Drug use.  Emotional well-being.  Home and relationship well-being.  Sexual activity.  Eating habits.  Work and work environment.  Method of birth control.  Menstrual cycle.  Pregnancy history. Screening You may have the following tests or measurements:  Height, weight, and BMI.  Diabetes screening. This is done by checking your blood sugar (glucose) after you have not eaten for a while (fasting).  Blood pressure.  Lipid and cholesterol levels. These may be checked every 5 years starting at age 20.  Skin check.  Hepatitis C blood test.  Hepatitis B blood test.  Sexually transmitted disease (STD) testing.  BRCA-related cancer screening. This may be done if you have a family history of breast, ovarian, tubal, or peritoneal cancers.  Pelvic exam and Pap test. This may be done every 3 years starting at age 21. Starting at age 30, this may be done  every 5 years if you have a Pap test in combination with an HPV test. Discuss your test results, treatment options, and if necessary, the need for more tests with your health care provider. Vaccines Your health care provider may recommend certain vaccines, such as:  Influenza vaccine. This is recommended every year.  Tetanus, diphtheria, and acellular pertussis (Tdap, Td) vaccine. You may need a Td booster every 10 years.  Varicella vaccine. You may need this if you have not been vaccinated.  HPV vaccine. If you are 26 or younger, you may need three doses over 6 months.  Measles, mumps, and rubella (MMR) vaccine. You may need at least one dose of MMR. You may also need a second dose.  Pneumococcal 13-valent conjugate (PCV13) vaccine. You may need this if you have certain conditions and were not previously vaccinated.  Pneumococcal polysaccharide (PPSV23) vaccine. You may need one or two doses if you smoke cigarettes or if you have certain conditions.  Meningococcal vaccine. One dose is recommended if you are age 19-21 years and a first-year college student living in a residence hall, or if you have one of several medical conditions. You may also need additional booster doses.  Hepatitis A vaccine. You may need this if you have certain conditions or if you travel or work in places where you may be exposed to hepatitis A.  Hepatitis B vaccine. You may need this if you have certain conditions or if you travel or work in places where you may be exposed to hepatitis B.  Haemophilus influenzae type b (Hib) vaccine. You may need this if   you have certain risk factors. Talk to your health care provider about which screenings and vaccines you need and how often you need them. This information is not intended to replace advice given to you by your health care provider. Make sure you discuss any questions you have with your health care provider. Document Released: 05/15/2001 Document Revised:  10/30/2016 Document Reviewed: 01/18/2015 Elsevier Interactive Patient Education  2019 Elsevier Inc.  

## 2018-07-21 ENCOUNTER — Ambulatory Visit: Payer: Federal, State, Local not specified - PPO

## 2019-02-13 ENCOUNTER — Other Ambulatory Visit: Payer: Self-pay

## 2019-02-13 ENCOUNTER — Telehealth: Payer: Self-pay

## 2019-02-13 MED ORDER — METRONIDAZOLE 500 MG PO TABS: 500.0000 mg | ORAL_TABLET | Freq: Two times a day (BID) | ORAL | 0 refills | Status: DC

## 2019-02-13 NOTE — Telephone Encounter (Signed)
Patient thinks she has BV again and would like to try flagyl. Flagyl called into [facility] on [address].

## 2019-04-23 ENCOUNTER — Encounter: Payer: Self-pay | Admitting: Radiology

## 2019-06-21 ENCOUNTER — Other Ambulatory Visit: Payer: Self-pay | Admitting: Family Medicine

## 2019-06-21 DIAGNOSIS — B3731 Acute candidiasis of vulva and vagina: Secondary | ICD-10-CM

## 2019-06-21 DIAGNOSIS — B373 Candidiasis of vulva and vagina: Secondary | ICD-10-CM

## 2019-07-15 DIAGNOSIS — K080 Exfoliation of teeth due to systemic causes: Secondary | ICD-10-CM | POA: Diagnosis not present

## 2019-08-04 ENCOUNTER — Ambulatory Visit: Payer: Federal, State, Local not specified - PPO | Admitting: Obstetrics and Gynecology

## 2019-08-11 ENCOUNTER — Other Ambulatory Visit: Payer: Self-pay

## 2019-08-11 ENCOUNTER — Ambulatory Visit (INDEPENDENT_AMBULATORY_CARE_PROVIDER_SITE_OTHER): Payer: Federal, State, Local not specified - PPO | Admitting: Family Medicine

## 2019-08-11 ENCOUNTER — Other Ambulatory Visit (HOSPITAL_COMMUNITY)
Admission: RE | Admit: 2019-08-11 | Discharge: 2019-08-11 | Disposition: A | Discharge status: Home / Self Care | Payer: Federal, State, Local not specified - PPO | Source: Ambulatory Visit | Attending: Family Medicine | Admitting: Family Medicine

## 2019-08-11 ENCOUNTER — Encounter: Payer: Self-pay | Admitting: Family Medicine

## 2019-08-11 VITALS — BP 121/75 | HR 75 | Wt 275.0 lb

## 2019-08-11 DIAGNOSIS — Z124 Encounter for screening for malignant neoplasm of cervix: Secondary | ICD-10-CM

## 2019-08-11 DIAGNOSIS — N920 Excessive and frequent menstruation with regular cycle: Secondary | ICD-10-CM | POA: Diagnosis not present

## 2019-08-11 DIAGNOSIS — Z113 Encounter for screening for infections with a predominantly sexual mode of transmission: Secondary | ICD-10-CM | POA: Insufficient documentation

## 2019-08-11 DIAGNOSIS — Z01419 Encounter for gynecological examination (general) (routine) without abnormal findings: Secondary | ICD-10-CM

## 2019-08-11 DIAGNOSIS — Z975 Presence of (intrauterine) contraceptive device: Secondary | ICD-10-CM

## 2019-08-11 DIAGNOSIS — B354 Tinea corporis: Secondary | ICD-10-CM

## 2019-08-11 DIAGNOSIS — Z9851 Tubal ligation status: Secondary | ICD-10-CM

## 2019-08-11 MED ORDER — NYSTATIN 100000 UNIT/GM EX CREA: 1.0000 "application " | TOPICAL_CREAM | Freq: Two times a day (BID) | CUTANEOUS | 3 refills | Status: DC

## 2019-08-11 NOTE — Assessment & Plan Note (Signed)
Continue IUD placed 2016. May continue until begins bleeding. S/p BTL so not needed for contraception only.

## 2019-08-11 NOTE — Progress Notes (Signed)
  Subjective:     Candice Arellano is a 44 y.o. female and is here for a comprehensive physical exam. The patient reports no problems. S/p BTL, has IUD in for bleeding control and working well. Reports some itchy rash under her breasts, which she has had in the past. 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   The following portions of the patient's history were reviewed and updated as appropriate: allergies, current medications, past family history, past medical history, past social history, past surgical history and problem list.  Review of Systems Pertinent items noted in HPI and remainder of comprehensive ROS otherwise negative.   Objective:    BP 121/75   Pulse 75   Wt 275 lb (124.7 kg)   BMI 48.71 kg/m  General appearance: alert, cooperative, appears stated age and moderately obese Head: Normocephalic, without obvious abnormality, atraumatic Neck: no adenopathy, supple, symmetrical, trachea midline and thyroid not enlarged, symmetric, no tenderness/mass/nodules Lungs: clear to auscultation bilaterally Breasts: normal appearance, no masses or tenderness Heart: regular rate and rhythm, S1, S2 normal, no murmur, click, rub or gallop Abdomen: soft, non-tender; bowel sounds normal; no masses,  no organomegaly Pelvic: cervix normal in appearance, external genitalia normal, no adnexal masses or tenderness, no cervical motion tenderness, uterus normal size, shape, and consistency and vagina normal without discharge Extremities: extremities normal, atraumatic, no cyanosis or edema Pulses: 2+ and symmetric Skin: Skin color, texture, turgor normal. No rashes or lesions Lymph nodes: Cervical, supraclavicular, and axillary nodes normal. Neurologic: Grossly normal    Assessment:    Healthy female exam.      Plan:      Problem List Items Addressed This Visit      Unprioritized   Menorrhagia - Primary    Continue IUD placed 2016. May continue until begins bleeding. S/p BTL so not  needed for contraception only.       Other Visit Diagnoses    Screening for malignant neoplasm of cervix       Relevant Orders   Cytology - PAP( [place])   Encounter for gynecological examination without abnormal finding       Relevant Orders   MM 3D SCREEN BREAST BILATERAL   Tinea corporis       topical anti-fungal.   Relevant Medications   nystatin cream (MYCOSTATIN)   Screening for STD (sexually transmitted disease)       Relevant Orders   Cervicovaginal ancillary only( )     PCP does labs See After Visit Summary for Counseling Recommendations

## 2019-08-11 NOTE — Progress Notes (Signed)
Doing well today Would like Sti testing

## 2019-08-11 NOTE — Patient Instructions (Signed)
 Preventive Care 21-42 Years Old, Female Preventive care refers to visits with your health care provider and lifestyle choices that can promote health and wellness. This includes:  A yearly physical exam. This may also be called an annual well check.  Regular dental visits and eye exams.  Immunizations.  Screening for certain conditions.  Healthy lifestyle choices, such as eating a healthy diet, getting regular exercise, not using drugs or products that contain nicotine and tobacco, and limiting alcohol use. What can I expect for my preventive care visit? Physical exam Your health care provider will check your:  Height and weight. This may be used to calculate body mass index (BMI), which tells if you are at a healthy weight.  Heart rate and blood pressure.  Skin for abnormal spots. Counseling Your health care provider may ask you questions about your:  Alcohol, tobacco, and drug use.  Emotional well-being.  Home and relationship well-being.  Sexual activity.  Eating habits.  Work and work environment.  Method of birth control.  Menstrual cycle.  Pregnancy history. What immunizations do I need?  Influenza (flu) vaccine  This is recommended every year. Tetanus, diphtheria, and pertussis (Tdap) vaccine  You may need a Td booster every 10 years. Varicella (chickenpox) vaccine  You may need this if you have not been vaccinated. Human papillomavirus (HPV) vaccine  If recommended by your health care provider, you may need three doses over 6 months. Measles, mumps, and rubella (MMR) vaccine  You may need at least one dose of MMR. You may also need a second dose. Meningococcal conjugate (MenACWY) vaccine  One dose is recommended if you are age 19-21 years and a first-year college student living in a residence hall, or if you have one of several medical conditions. You may also need additional booster doses. Pneumococcal conjugate (PCV13) vaccine  You may need  this if you have certain conditions and were not previously vaccinated. Pneumococcal polysaccharide (PPSV23) vaccine  You may need one or two doses if you smoke cigarettes or if you have certain conditions. Hepatitis A vaccine  You may need this if you have certain conditions or if you travel or work in places where you may be exposed to hepatitis A. Hepatitis B vaccine  You may need this if you have certain conditions or if you travel or work in places where you may be exposed to hepatitis B. Haemophilus influenzae type b (Hib) vaccine  You may need this if you have certain conditions. You may receive vaccines as individual doses or as more than one vaccine together in one shot (combination vaccines). Talk with your health care provider about the risks and benefits of combination vaccines. What tests do I need?  Blood tests  Lipid and cholesterol levels. These may be checked every 5 years starting at age 20.  Hepatitis C test.  Hepatitis B test. Screening  Diabetes screening. This is done by checking your blood sugar (glucose) after you have not eaten for a while (fasting).  Sexually transmitted disease (STD) testing.  BRCA-related cancer screening. This may be done if you have a family history of breast, ovarian, tubal, or peritoneal cancers.  Pelvic exam and Pap test. This may be done every 3 years starting at age 21. Starting at age 30, this may be done every 5 years if you have a Pap test in combination with an HPV test. Talk with your health care provider about your test results, treatment options, and if necessary, the need for more   tests. Follow these instructions at home: Eating and drinking   Eat a diet that includes fresh fruits and vegetables, whole grains, lean protein, and low-fat dairy.  Take vitamin and mineral supplements as recommended by your health care provider.  Do not drink alcohol if: ? Your health care provider tells you not to drink. ? You are  pregnant, may be pregnant, or are planning to become pregnant.  If you drink alcohol: ? Limit how much you have to 0-1 drink a day. ? Be aware of how much alcohol is in your drink. In the [place] one drink equals one 12 oz bottle of beer (355 mL), one 5 oz glass of wine (148 mL), or one 1 oz glass of hard liquor (44 mL). Lifestyle  Take daily care of your teeth and gums.  Stay active. Exercise for at least 30 minutes on 5 or more days each week.  Do not use any products that contain nicotine or tobacco, such as cigarettes, e-cigarettes, and chewing tobacco. If you need help quitting, ask your health care provider.  If you are sexually active, practice safe sex. Use a condom or other form of birth control (contraception) in order to prevent pregnancy and STIs (sexually transmitted infections). If you plan to become pregnant, see your health care provider for a preconception visit. What's next?  Visit your health care provider once a year for a well check visit.  Ask your health care provider how often you should have your eyes and teeth checked.  Stay up to date on all vaccines. This information is not intended to replace advice given to you by your health care provider. Make sure you discuss any questions you have with your health care provider. Document Revised: 11/28/2017 Document Reviewed: 11/28/2017 Elsevier Patient Education  2020 Elsevier Inc.  

## 2019-08-13 LAB — CERVICOVAGINAL ANCILLARY ONLY
Bacterial Vaginitis (gardnerella): NEGATIVE
Candida Glabrata: NEGATIVE
Candida Vaginitis: NEGATIVE
Chlamydia: NEGATIVE
Comment: NEGATIVE
Comment: NEGATIVE
Comment: NEGATIVE
Comment: NEGATIVE
Comment: NEGATIVE
Comment: NORMAL
Neisseria Gonorrhea: NEGATIVE
Trichomonas: NEGATIVE

## 2019-08-13 LAB — CYTOLOGY - PAP
Comment: NEGATIVE
Diagnosis: NEGATIVE
High risk HPV: NEGATIVE

## 2019-08-18 ENCOUNTER — Ambulatory Visit: Payer: Federal, State, Local not specified - PPO

## 2019-08-18 ENCOUNTER — Ambulatory Visit
Admission: RE | Admit: 2019-08-18 | Discharge: 2019-08-18 | Disposition: A | Discharge status: Home / Self Care | Payer: Federal, State, Local not specified - PPO | Source: Ambulatory Visit | Attending: Family Medicine | Admitting: Family Medicine

## 2019-08-18 ENCOUNTER — Other Ambulatory Visit: Payer: Self-pay

## 2019-08-18 DIAGNOSIS — Z1231 Encounter for screening mammogram for malignant neoplasm of breast: Secondary | ICD-10-CM | POA: Diagnosis not present

## 2019-08-18 DIAGNOSIS — Z01419 Encounter for gynecological examination (general) (routine) without abnormal findings: Secondary | ICD-10-CM

## 2019-11-23 ENCOUNTER — Ambulatory Visit (INDEPENDENT_AMBULATORY_CARE_PROVIDER_SITE_OTHER): Payer: Federal, State, Local not specified - PPO | Admitting: Nurse Practitioner

## 2019-11-23 ENCOUNTER — Encounter: Payer: Self-pay | Admitting: Nurse Practitioner

## 2019-11-23 ENCOUNTER — Other Ambulatory Visit: Payer: Self-pay

## 2019-11-23 VITALS — BP 126/80 | HR 82 | Temp 97.6°F | Ht 64.0 in | Wt 271.8 lb

## 2019-11-23 DIAGNOSIS — Z136 Encounter for screening for cardiovascular disorders: Secondary | ICD-10-CM

## 2019-11-23 DIAGNOSIS — Z13220 Encounter for screening for lipoid disorders: Secondary | ICD-10-CM

## 2019-11-23 DIAGNOSIS — Z0000 Encounter for general adult medical examination without abnormal findings: Secondary | ICD-10-CM | POA: Diagnosis not present

## 2019-11-23 LAB — LIPID PANEL
Cholesterol: 136 mg/dL (ref 0–200)
HDL: 38.3 mg/dL — ABNORMAL LOW (ref >39.00)
LDL Cholesterol: 79 mg/dL (ref 0–99)
NonHDL: 98.13
Total CHOL/HDL Ratio: 4
Triglycerides: 97 mg/dL (ref 0.0–149.0)
VLDL: 19.4 mg/dL (ref 0.0–40.0)

## 2019-11-23 LAB — CBC WITH DIFFERENTIAL/PLATELET
Basophils Absolute: 0 10*3/uL (ref 0.0–0.1)
Basophils Relative: 0.4 % (ref 0.0–3.0)
Eosinophils Absolute: 0.1 10*3/uL (ref 0.0–0.7)
Eosinophils Relative: 1.9 % (ref 0.0–5.0)
HCT: 33.1 % — ABNORMAL LOW (ref 36.0–46.0)
Hemoglobin: 10.9 g/dL — ABNORMAL LOW (ref 12.0–15.0)
Lymphocytes Relative: 27 % (ref 12.0–46.0)
Lymphs Abs: 1.9 10*3/uL (ref 0.7–4.0)
MCHC: 32.8 g/dL (ref 30.0–36.0)
MCV: 73 fl — ABNORMAL LOW (ref 78.0–100.0)
Monocytes Absolute: 0.5 10*3/uL (ref 0.1–1.0)
Monocytes Relative: 6.8 % (ref 3.0–12.0)
Neutro Abs: 4.4 10*3/uL (ref 1.4–7.7)
Neutrophils Relative %: 63.9 % (ref 43.0–77.0)
Platelets: 302 10*3/uL (ref 150.0–400.0)
RBC: 4.54 Mil/uL (ref 3.87–5.11)
RDW: 15.1 % (ref 11.5–15.5)
WBC: 6.9 10*3/uL (ref 4.0–10.5)

## 2019-11-23 LAB — COMPREHENSIVE METABOLIC PANEL
ALT: 14 U/L (ref 0–35)
AST: 14 U/L (ref 0–37)
Albumin: 4 g/dL (ref 3.5–5.2)
Alkaline Phosphatase: 54 U/L (ref 39–117)
BUN: 10 mg/dL (ref 6–23)
CO2: 26 mEq/L (ref 19–32)
Calcium: 9.1 mg/dL (ref 8.4–10.5)
Chloride: 104 mEq/L (ref 96–112)
Creatinine, Ser: 0.67 mg/dL (ref 0.40–1.20)
GFR: 116.5 mL/min (ref >60.00)
Glucose, Bld: 92 mg/dL (ref 70–99)
Potassium: 4.4 mEq/L (ref 3.5–5.1)
Sodium: 139 mEq/L (ref 135–145)
Total Bilirubin: 0.4 mg/dL (ref 0.2–1.2)
Total Protein: 7.6 g/dL (ref 6.0–8.3)

## 2019-11-23 LAB — TSH: TSH: 1.23 u[IU]/mL (ref 0.35–4.50)

## 2019-11-23 NOTE — Patient Instructions (Signed)
Go to lab for blood draw  Start daily exercise and maintain DASH diet to promote weight loss.   Health Maintenance, Female Adopting a healthy lifestyle and getting preventive care are important in promoting health and wellness. Ask your health care provider about:  The right schedule for you to have regular tests and exams.  Things you can do on your own to prevent diseases and keep yourself healthy. What should I know about diet, weight, and exercise? Eat a healthy diet   Eat a diet that includes plenty of vegetables, fruits, low-fat dairy products, and lean protein.  Do not eat a lot of foods that are high in solid fats, added sugars, or sodium. Maintain a healthy weight Body mass index (BMI) is used to identify weight problems. It estimates body fat based on height and weight. Your health care provider can help determine your BMI and help you achieve or maintain a healthy weight. Get regular exercise Get regular exercise. This is one of the most important things you can do for your health. Most adults should:  Exercise for at least 150 minutes each week. The exercise should increase your heart rate and make you sweat (moderate-intensity exercise).  Do strengthening exercises at least twice a week. This is in addition to the moderate-intensity exercise.  Spend less time sitting. Even light physical activity can be beneficial. Watch cholesterol and blood lipids Have your blood tested for lipids and cholesterol at 42 years of age, then have this test every 5 years. Have your cholesterol levels checked more often if:  Your lipid or cholesterol levels are high.  You are older than 42 years of age.  You are at high risk for heart disease. What should I know about cancer screening? Depending on your health history and family history, you may need to have cancer screening at various ages. This may include screening for:  Breast cancer.  Cervical cancer.  Colorectal  cancer.  Skin cancer.  Lung cancer. What should I know about heart disease, diabetes, and high blood pressure? Blood pressure and heart disease  High blood pressure causes heart disease and increases the risk of stroke. This is more likely to develop in people who have high blood pressure readings, are of African descent, or are overweight.  Have your blood pressure checked: ? Every 3-5 years if you are 56-20 years of age. ? Every year if you are 33 years old or older. Diabetes Have regular diabetes screenings. This checks your fasting blood sugar level. Have the screening done:  Once every three years after age 24 if you are at a normal weight and have a low risk for diabetes.  More often and at a younger age if you are overweight or have a high risk for diabetes. What should I know about preventing infection? Hepatitis B If you have a higher risk for hepatitis B, you should be screened for this virus. Talk with your health care provider to find out if you are at risk for hepatitis B infection. Hepatitis C Testing is recommended for:  Everyone born from [birth year] through 1965.  Anyone with known risk factors for hepatitis C. Sexually transmitted infections (STIs)  Get screened for STIs, including gonorrhea and chlamydia, if: ? You are sexually active and are younger than 42 years of age. ? You are older than 42 years of age and your health care provider tells you that you are at risk for this type of infection. ? Your sexual activity has changed since  you were last screened, and you are at increased risk for chlamydia or gonorrhea. Ask your health care provider if you are at risk.  Ask your health care provider about whether you are at high risk for HIV. Your health care provider may recommend a prescription medicine to help prevent HIV infection. If you choose to take medicine to prevent HIV, you should first get tested for HIV. You should then be tested every 3 months for as long as  you are taking the medicine. Pregnancy  If you are about to stop having your period (premenopausal) and you may become pregnant, seek counseling before you get pregnant.  Take 400 to 800 micrograms (mcg) of folic acid every day if you become pregnant.  Ask for birth control (contraception) if you want to prevent pregnancy. Osteoporosis and menopause Osteoporosis is a disease in which the bones lose minerals and strength with aging. This can result in bone fractures. If you are 37 years old or older, or if you are at risk for osteoporosis and fractures, ask your health care provider if you should:  Be screened for bone loss.  Take a calcium or vitamin D supplement to lower your risk of fractures.  Be given hormone replacement therapy (HRT) to treat symptoms of menopause. Follow these instructions at home: Lifestyle  Do not use any products that contain nicotine or tobacco, such as cigarettes, e-cigarettes, and chewing tobacco. If you need help quitting, ask your health care provider.  Do not use street drugs.  Do not share needles.  Ask your health care provider for help if you need support or information about quitting drugs. Alcohol use  Do not drink alcohol if: ? Your health care provider tells you not to drink. ? You are pregnant, may be pregnant, or are planning to become pregnant.  If you drink alcohol: ? Limit how much you use to 0-1 drink a day. ? Limit intake if you are breastfeeding.  Be aware of how much alcohol is in your drink. In the [place] one drink equals one 12 oz bottle of beer (355 mL), one 5 oz glass of wine (148 mL), or one 1 oz glass of hard liquor (44 mL). General instructions  Schedule regular health, dental, and eye exams.  Stay current with your vaccines.  Tell your health care provider if: ? You often feel depressed. ? You have ever been abused or do not feel safe at home. Summary  Adopting a healthy lifestyle and getting preventive care are  important in promoting health and wellness.  Follow your health care provider's instructions about healthy diet, exercising, and getting tested or screened for diseases.  Follow your health care provider's instructions on monitoring your cholesterol and blood pressure. This information is not intended to replace advice given to you by your health care provider. Make sure you discuss any questions you have with your health care provider. Document Revised: 03/12/2018 Document Reviewed: 03/12/2018 Elsevier Patient Education  2020 [employer].

## 2019-11-23 NOTE — Progress Notes (Signed)
Subjective:    Patient ID: Candice Arellano, female    DOB: 30-Nov-1975, 44 y.o.   MRN: [id number]  Patient presents today for CPE   HPI  Sexual History (orientation,birth control, marital status, STD):up to date with breast and pelvic exam, completed by GYN  Depression/Suicide: Depression screen [facility] 2/9 11/23/2019 05/19/2018  Decreased Interest 0 0  Down, Depressed, Hopeless 0 0  PHQ - 2 Score 0 0   Vision:up to date  Dental:up to date  Immunizations: (TDAP, Hep C screen, Pneumovax, Influenza, zoster)  Health Maintenance  Topic Date Due  . Flu Shot  11/01/2019  . Tetanus Vaccine  01/11/2021  . Pap Smear  08/11/2022  . COVID-19 Vaccine  Completed  .  Hepatitis C: One time screening is recommended by Center for Disease Control  (CDC) for  adults born from [birth year] through 1965.   Completed  . HIV Screening  Completed   Diet:regular  Weight:  Wt Readings from Last 3 Encounters:  11/23/19 271 lb 12.8 oz (123.3 kg)  08/11/19 275 lb (124.7 kg)  06/05/18 273 lb 3.2 oz (123.9 kg)    Fall Risk: Fall Risk  05/19/2018  Falls in the past year? 0   Medications and allergies reviewed with patient and updated if appropriate.  Patient Active Problem List   Diagnosis Date Noted  . Sciatica 06/03/2014  . Cervical intraepithelial neoplasia grade 3 11/07/2013  . Anemia 10/18/2010  . Obesity 10/18/2010  . Menorrhagia 10/18/2010    Current Outpatient Medications on File Prior to Visit  Medication Sig Dispense Refill  . Lactobacillus (PROBIOTIC ACIDOPHILUS PO) Take 1 tablet by mouth daily.    Marland Kitchen levonorgestrel (MIRENA) 20 MCG/24HR IUD 1 each by Intrauterine route once.    . nystatin cream (MYCOSTATIN) Apply 1 application topically 2 (two) times daily. 30 g 3   No current facility-administered medications on file prior to visit.    Past Medical History:  Diagnosis Date  . Abnormal Pap smear of cervix 2013   colpo was negative per patient  . Allergy   . Anemia   . Chicken pox     . Depression   . Headache   . Hypertension   . UTI (urinary tract infection)     Past Surgical History:  Procedure Laterality Date  . CESAREAN SECTION  2004  . LEEP    . TENDON REPAIR Left 1992   left thumb  . TUBAL LIGATION      Social History   Socioeconomic History  . Marital status: Divorced    Spouse name: Not on file  . Number of children: 2  . Years of education: Not on file  . Highest education level: Not on file  Occupational History  . Occupation: unemployed  Tobacco Use  . Smoking status: Never Smoker  . Smokeless tobacco: Never Used  Vaping Use  . Vaping Use: Never used  Substance and Sexual Activity  . Alcohol use: Yes    Alcohol/week: 1.0 standard drink    Types: 1 Glasses of wine per week    Comment: occasionally  . Drug use: No  . Sexual activity: Yes    Partners: Male    Birth control/protection: Surgical, I.U.D.    Comment: tubal ligation, IUD inserted 2016  Other Topics Concern  . Not on file  Social History Narrative   Lives with parents and daughter and son. In a relationship for past year and a half.  Divorced.  Unemployed.  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   Social Determinants of Health   Financial Resource Strain:   . Difficulty of Paying Living Expenses: Not on file  Food Insecurity:   . Worried About Programme researcher, broadcasting/film/video in the Last Year: Not on file  . Ran Out of Food in the Last Year: Not on file  Transportation Needs:   . Lack of Transportation (Medical): Not on file  . Lack of Transportation (Non-Medical): Not on file  Physical Activity:   . Days of Exercise per Week: Not on file  . Minutes of Exercise per Session: Not on file  Stress:   . Feeling of Stress : Not on file  Social Connections:   . Frequency of Communication with Friends and Family: Not on file  . Frequency of Social Gatherings with Friends and Family: Not  on file  . Attends Religious Services: Not on file  . Active Member of Clubs or Organizations: Not on file  . Attends Banker Meetings: Not on file  . Marital Status: Not on file    Family History  Problem Relation Age of Onset  . Hypertension Mother   . Arthritis Mother        OA  . Hypertension Father   . Asthma Father   . Diabetes Father   . Heart disease Paternal Grandmother 51       CABG, MI  . Arthritis Paternal Grandmother   . Asthma Daughter   . Arthritis Maternal Grandmother   . Diabetes Maternal Grandmother   . Hypertension Maternal Grandmother   . Kidney disease Maternal Grandfather        unknown cause  . Heart attack Paternal Grandfather 54       MI cause of death  . Cancer Neg Hx   . Stroke Neg Hx   . Depression Neg Hx   . Alcohol abuse Neg Hx   . Drug abuse Neg Hx   . Breast cancer Neg Hx         Review of Systems  Constitutional: Negative for fever, malaise/fatigue and weight loss.  HENT: Negative for congestion and sore throat.   Eyes:       Negative for visual changes  Respiratory: Negative for cough and shortness of breath.   Cardiovascular: Negative for chest pain, palpitations and leg swelling.  Gastrointestinal: Negative for blood in stool, constipation, diarrhea and heartburn.  Genitourinary: Negative for dysuria, frequency and urgency.  Musculoskeletal: Negative for falls, joint pain and myalgias.  Skin: Negative for rash.  Neurological: Negative for dizziness, sensory change and headaches.  Endo/Heme/Allergies: Does not bruise/bleed easily.  Psychiatric/Behavioral: Negative for depression, substance abuse and suicidal ideas. The patient is not nervous/anxious.    Objective:   Vitals:   11/23/19 1328  BP: 126/80  Pulse: 82  Temp: 97.6 F (36.4 C)  SpO2: 98%   Body mass index is 46.65 kg/m.  Physical Examination:  Physical Exam Vitals reviewed.  Constitutional:      General: She is not in acute distress.     Appearance: She is well-developed. She is obese.  HENT:     Right Ear: Tympanic membrane, ear canal and external ear normal.     Left Ear: Tympanic membrane, ear canal and external ear normal.  Eyes:     Extraocular Movements: Extraocular movements intact.     Conjunctiva/sclera: Conjunctivae normal.  Cardiovascular:  Rate and Rhythm: Normal rate and regular rhythm.     Pulses: Normal pulses.     Heart sounds: Normal heart sounds.  Pulmonary:     Effort: Pulmonary effort is normal. No respiratory distress.     Breath sounds: Normal breath sounds.  Chest:     Chest wall: No tenderness.  Abdominal:     General: Bowel sounds are normal.     Palpations: Abdomen is soft.  Genitourinary:    Comments: Breast and pelvic exam deferred to GYN Musculoskeletal:        General: Normal range of motion.     Cervical back: Normal range of motion and neck supple.     Right lower leg: No edema.     Left lower leg: No edema.  Skin:    General: Skin is warm and dry.  Neurological:     Mental Status: She is alert and oriented to person, place, and time.     Deep Tendon Reflexes: Reflexes are normal and symmetric.  Psychiatric:        Mood and Affect: Mood normal.        Behavior: Behavior normal.        Thought Content: Thought content normal.     ASSESSMENT and PLAN: This visit occurred during the SARS-CoV-2 public health emergency.  Safety protocols were in place, including screening questions prior to the visit, additional usage of staff PPE, and extensive cleaning of exam room while observing appropriate contact time as indicated for disinfecting solutions.   Candice Arellano was seen today for annual exam.  Diagnoses and all orders for this visit:  Preventative health care -     CBC with Differential/Platelet -     Comprehensive metabolic panel -     TSH -     Lipid panel  Encounter for lipid screening for cardiovascular disease -     Lipid panel       Problem List Items  Addressed This Visit    None    Visit Diagnoses    Preventative health care    -  Primary   Relevant Orders   CBC with Differential/Platelet   Comprehensive metabolic panel   TSH   Lipid panel   Encounter for lipid screening for cardiovascular disease       Relevant Orders   Lipid panel      Follow up: Return in about 1 year (around 11/22/2020) for CPE (fasting).  Alysia Penna, NP

## 2020-03-15 DIAGNOSIS — K080 Exfoliation of teeth due to systemic causes: Secondary | ICD-10-CM | POA: Diagnosis not present

## 2020-04-19 ENCOUNTER — Ambulatory Visit: Payer: Self-pay | Admitting: Allergy & Immunology

## 2020-04-20 ENCOUNTER — Ambulatory Visit: Payer: Federal, State, Local not specified - PPO | Admitting: Allergy

## 2020-04-20 ENCOUNTER — Encounter: Payer: Self-pay | Admitting: Allergy

## 2020-04-20 ENCOUNTER — Other Ambulatory Visit: Payer: Self-pay

## 2020-04-20 VITALS — BP 130/88 | HR 84 | Temp 97.7°F | Resp 16 | Ht 63.0 in | Wt 270.6 lb

## 2020-04-20 DIAGNOSIS — H1013 Acute atopic conjunctivitis, bilateral: Secondary | ICD-10-CM | POA: Diagnosis not present

## 2020-04-20 DIAGNOSIS — J3089 Other allergic rhinitis: Secondary | ICD-10-CM

## 2020-04-20 DIAGNOSIS — T781XXD Other adverse food reactions, not elsewhere classified, subsequent encounter: Secondary | ICD-10-CM | POA: Diagnosis not present

## 2020-04-20 DIAGNOSIS — L2089 Other atopic dermatitis: Secondary | ICD-10-CM | POA: Diagnosis not present

## 2020-04-20 MED ORDER — TRIAMCINOLONE ACETONIDE 0.5 % EX OINT: 1.0000 "application " | TOPICAL_OINTMENT | Freq: Two times a day (BID) | CUTANEOUS | 5 refills | Status: DC | PRN

## 2020-04-20 MED ORDER — AZELASTINE-FLUTICASONE 137-50 MCG/ACT NA SUSP: 1.0000 | Freq: Two times a day (BID) | NASAL | 5 refills | Status: DC

## 2020-04-20 NOTE — Progress Notes (Signed)
New Patient Note  RE: Candice Arellano MRN: [id number] DOB: 24-Apr-1975 Date of Office Visit: 04/20/2020  Referring provider: Anne Ng, NP Primary care provider: Anne Ng, NP  Chief Complaint: allergy testing  History of present illness: Candice Arellano is a 45 y.o. female presenting today for consultation for allergies.   She reports itchy/watery eyes, runny nose/stuffy nose and sneezing. She states she will take zyrtec or other over-the-counter anthihistamines which do help. Has used visine eye drops.  Symptoms worse in spring and summer primarily.   She states she had allergy testing done in middle school and wants to have repeat testing done at this time.  She states she was positive to pollens and other allergens.    She does not report having history of food allergy.  She does report with acidic fruits can cause mouth and gums to itch.    During the summer time she usually gets an itchy rash on neck.  Cortisone helps this.   No history of asthma.   Review of systems: Review of Systems  Constitutional: Negative.   HENT:       See HPI  Eyes:       See HPI  Respiratory: Negative.   Cardiovascular: Negative.   Gastrointestinal: Negative.   Musculoskeletal: Negative.   Skin: Negative.   Neurological: Negative.     All other systems negative unless noted above in HPI  Past medical history: Past Medical History:  Diagnosis Date  . Abnormal Pap smear of cervix 2013   colpo was negative per patient  . Allergy   . Anemia   . Chicken pox   . Depression   . Headache   . Hypertension   . UTI (urinary tract infection)     Past surgical history: Past Surgical History:  Procedure Laterality Date  . CESAREAN SECTION  2004  . LEEP    . TENDON REPAIR Left 1992   left thumb  . TUBAL LIGATION      Family history:  Family History  Problem Relation Age of Onset  . Hypertension Mother   . Arthritis Mother        OA  . Hypertension Father    . Asthma Father   . Diabetes Father   . Heart disease Paternal Grandmother 16       CABG, MI  . Arthritis Paternal Grandmother   . Asthma Daughter   . Arthritis Maternal Grandmother   . Diabetes Maternal Grandmother   . Hypertension Maternal Grandmother   . Kidney disease Maternal Grandfather        unknown cause  . Heart attack Paternal Grandfather 72       MI cause of death  . Cancer Neg Hx   . Stroke Neg Hx   . Depression Neg Hx   . Alcohol abuse Neg Hx   . Drug abuse Neg Hx   . Breast cancer Neg Hx     Social history: Lives in a townhome with carpeting with gas heating and central cooling.  No pets in the home.  There is no concern for water damage, mildew or roaches in the home.  She is a Armed forces logistics/support/administrative officer.  Does not report a smoking history  Medication List: Current Outpatient Medications  Medication Sig Dispense Refill  . Azelastine-Fluticasone (DYMISTA) 137-50 MCG/ACT SUSP Place 1 spray into the nose in the morning and at bedtime. 23 g 5  . fluconazole (DIFLUCAN) 150 MG tablet Take 150 mg by  mouth once.    . Lactobacillus (PROBIOTIC ACIDOPHILUS PO) Take 1 tablet by mouth daily.    Marland Kitchen levonorgestrel (MIRENA) 20 MCG/24HR IUD 1 each by Intrauterine route once.    . nystatin cream (MYCOSTATIN) Apply 1 application topically 2 (two) times daily. 30 g 3  . triamcinolone ointment (KENALOG) 0.5 % Apply 1 application topically 2 (two) times daily as needed. 30 g 5   No current facility-administered medications for this visit.    Known medication allergies: Allergies  Allergen Reactions  . Latex Itching     Physical examination: Blood pressure 130/88, pulse 84, temperature 97.7 F (36.5 C), resp. rate 16, height 5\' 3"  (1.6 m), weight 270 lb 9.6 oz (122.7 kg), SpO2 97 %.  General: Alert, interactive, in no acute distress. HEENT: PERRLA, TMs pearly gray, turbinates mildly edematous without discharge, post-pharynx non erythematous. Neck: Supple without  lymphadenopathy. Lungs: Clear to auscultation without wheezing, rhonchi or rales. {no increased work of breathing. CV: Normal S1, S2 without murmurs. Abdomen: Nondistended, nontender. Skin: Warm and dry, without lesions or rashes. Extremities:  No clubbing, cyanosis or edema. Neuro:   Grossly intact.  Diagnositics/Labs:  Allergy testing: Environmental allergy skin testing is positive to all grass pollens, cocklebur, ash, birch, hickory, pecan, sycamore, phoma betae, both dust mites, dog, mixed feathers, cockroach. Select food allergy skin testing is positive to shellfish mix.  Negative to orange, apple, peach, pineapple and the rest of the top 10 common foods Allergy testing results were read and interpreted by provider, documented by clinical staff.   Assessment and plan:   Environmental allergy - environmental allergy skin testing is positive to grasses, weeds, trees, mold, dust mite, dog, mixed feather and cockroach - for general allergy symptoms take zyrtec 10mg  daily as needed - for itchy/watery eyes use over-the-counter Pataday 1 drop each eye daily as needed - trial dymista 1 spray each nostril twice a day.  This is a combination nasal spray with Flonase + Astelin (nasal antihistamine).  This helps with both nasal congestion and drainage.  - if medication management is not effective then consider allergen immunotherapy.  allergen immunotherapy discussed today including protocol, benefits and risk.  Informational handout provided.  If interested in this therapuetic option you can check with your insurance carrier for coverage.  Let know if you would like to proceed with this option.    Adverse food reaction - skin testing to foods is negative to acidic food.  Shellfish is positive on skin test but you tolerate ingestion of shellfish without issue thus considered sensitized only and NOT allergic.  Keep shellfish in the diet.  - gum/mouth itch with acidic food.  This represents  pollen food allergy syndrome as below.  The oral allergy syndrome (OAS) or pollen-food allergy syndrome (PFAS) is a relatively common form of food allergy, particularly in adults. It typically occurs in people who have pollen allergies when the immune system "sees" proteins on the food that look like proteins on the pollen. This results in the allergy antibody (IgE) binding to the food instead of the pollen. Patients typically report itching and/or mild swelling of the mouth and throat immediately following ingestion of certain uncooked fruits (including nuts) or raw vegetables. Only a very small number of affected individuals experience systemic allergic reactions, such as anaphylaxis which occurs with true food allergies.    Eczema - allergen avoidance measures as above - keep body moisturized especially after bathing - can use triamcinolone ointment on itchy, dry, irritated, patchy, scaly,  flaky areas on body 1-2 times a day until improved  Follow-up in 6-12 months or sooner if needed  I appreciate the opportunity to take part in Kellen's care. Please do not hesitate to contact me with questions.  Sincerely,   Margo Aye, MD Allergy/Immunology [facility]

## 2020-04-20 NOTE — Patient Instructions (Addendum)
Environmental allergy - environmental allergy skin testing is positive to grasses, weeds, trees, mold, dust mite, dog, mixed feather and cockroach - for general allergy symptoms take zyrtec 10mg  daily as needed - for itchy/watery eyes use over-the-counter Pataday 1 drop each eye daily as needed - trial dymista 1 spray each nostril twice a day.  This is a combination nasal spray with Flonase + Astelin (nasal antihistamine).  This helps with both nasal congestion and drainage.  - if medication management is not effective then consider allergen immunotherapy.  allergen immunotherapy discussed today including protocol, benefits and risk.  Informational handout provided.  If interested in this therapuetic option you can check with your insurance carrier for coverage.  Let know if you would like to proceed with this option.    Adverse food reaction - skin testing to foods is negative to acidic food.  Shellfish is positive on skin test but you tolerate ingestion of shellfish without issue thus considered sensitized only and NOT allergic.  Keep shellfish in the diet.  - gum/mouth itch with acidic food.  This represents pollen food allergy syndrome as below.  The oral allergy syndrome (OAS) or pollen-food allergy syndrome (PFAS) is a relatively common form of food allergy, particularly in adults. It typically occurs in people who have pollen allergies when the immune system "sees" proteins on the food that look like proteins on the pollen. This results in the allergy antibody (IgE) binding to the food instead of the pollen. Patients typically report itching and/or mild swelling of the mouth and throat immediately following ingestion of certain uncooked fruits (including nuts) or raw vegetables. Only a very small number of affected individuals experience systemic allergic reactions, such as anaphylaxis which occurs with true food allergies.      -   Eczema - allergen avoidance measures as above - keep body  moisturized especially after bathing - can use triamcinolone ointment on itchy, dry, irritated, patchy, scaly, flaky areas on body 1-2 times a day until improved  Follow-up in 6-12 months or sooner if needed

## 2020-05-02 DIAGNOSIS — K080 Exfoliation of teeth due to systemic causes: Secondary | ICD-10-CM | POA: Diagnosis not present

## 2020-06-27 ENCOUNTER — Encounter: Payer: Self-pay | Admitting: Radiology

## 2020-07-01 DIAGNOSIS — Z8616 Personal history of COVID-19: Secondary | ICD-10-CM

## 2020-07-01 HISTORY — DX: Personal history of COVID-19: Z86.16

## 2020-07-27 ENCOUNTER — Encounter: Payer: Self-pay | Admitting: Family Medicine

## 2020-07-27 ENCOUNTER — Telehealth (INDEPENDENT_AMBULATORY_CARE_PROVIDER_SITE_OTHER): Payer: Federal, State, Local not specified - PPO | Admitting: Family Medicine

## 2020-07-27 VITALS — Ht 63.0 in | Wt 262.0 lb

## 2020-07-27 DIAGNOSIS — U071 COVID-19: Secondary | ICD-10-CM

## 2020-07-27 MED ORDER — BENZONATATE 100 MG PO CAPS: 100.0000 mg | ORAL_CAPSULE | Freq: Two times a day (BID) | ORAL | 0 refills | Status: DC | PRN

## 2020-07-27 NOTE — Progress Notes (Signed)
[facility] [address] [place] [address] Dept: [phone] Dept Fax: [phone]  Virtual Video Visit  I connected with Candice Arellano on 07/27/20 at 10:30 AM EDT by a video enabled telemedicine application and verified that I am speaking with the correct person using two identifiers.  Location patient: Home Location provider: Clinic Persons participating in the virtual visit: Patient, Provider  I discussed the limitations of evaluation and management by telemedicine and the availability of in person appointments. The patient expressed understanding and agreed to proceed.  Chief Complaint  Patient presents with  . Acute Visit    C/o having cough, chest congestion, body aches, ST, chills, fever, SOB x 3-4 days. She has taken Zyrtec, Claritin, Coricidin Cold/cough with little relief. (+) covid test yesterday.    SUBJECTIVE:  HPI: Candice Arellano is a 45 y.o. female who presents with a 3-4 day history of cough, difficulty sleeping, chills, body aches, headache, and sore throat. She has noted some dyspnea, though she states it is better today than yesterday. Ms. Lagrange tested positive for COVID yesterday. She was at a funeral this weekend for her grandmother. Now her mother, an aunt, and 2 cousins have also tested positive. She admits to a past history of allergic rhinitis. She has no prior history of wheezing. She has been taking Coricidin HPB and an OTC cough medicine.  Patient Active Problem List   Diagnosis Date Noted  . Sciatica 06/03/2014  . Cervical intraepithelial neoplasia grade 3 11/07/2013  . Anemia 10/18/2010  . Obesity 10/18/2010  . Menorrhagia 10/18/2010   Past Surgical History:  Procedure Laterality Date  . CESAREAN SECTION  2004  . LEEP    . TENDON REPAIR Left 1992   left thumb  . TUBAL LIGATION     Family History  Problem Relation Age of Onset  . Hypertension Mother   . Arthritis Mother         OA  . Hypertension Father   . Asthma Father   . Diabetes Father   . Heart disease Paternal Grandmother 57       CABG, MI  . Arthritis Paternal Grandmother   . Asthma Daughter   . Arthritis Maternal Grandmother   . Diabetes Maternal Grandmother   . Hypertension Maternal Grandmother   . Kidney disease Maternal Grandfather        unknown cause  . Heart attack Paternal Grandfather 67       MI cause of death  . Cancer Neg Hx   . Stroke Neg Hx   . Depression Neg Hx   . Alcohol abuse Neg Hx   . Drug abuse Neg Hx   . Breast cancer Neg Hx    Social History   Tobacco Use  . Smoking status: Never Smoker  . Smokeless tobacco: Never Used  Vaping Use  . Vaping Use: Never used  Substance Use Topics  . Alcohol use: Yes    Alcohol/week: 1.0 standard drink    Types: 1 Glasses of wine per week    Comment: occasionally  . Drug use: No    Current Outpatient Medications:  .  Azelastine-Fluticasone (DYMISTA) 137-50 MCG/ACT SUSP, Place 1 spray into the nose in the morning and at bedtime., Disp: 23 g, Rfl: 5 .  Lactobacillus (PROBIOTIC ACIDOPHILUS PO), Take 1 tablet by mouth daily., Disp: , Rfl:  .  levonorgestrel (MIRENA) 20 MCG/24HR IUD, 1 each by Intrauterine route once., Disp: , Rfl:  .  nystatin  cream (MYCOSTATIN), Apply 1 application topically 2 (two) times daily., Disp: 30 g, Rfl: 3 .  triamcinolone ointment (KENALOG) 0.5 %, Apply 1 application topically 2 (two) times daily as needed., Disp: 30 g, Rfl: 5  Allergies  Allergen Reactions  . Latex Itching   ROS: See pertinent positives and negatives per HPI.  OBSERVATIONS/OBJECTIVE:  VITALS per patient if applicable: Today's Vitals   07/27/20 1017  Weight: 262 lb (118.8 kg)  Height: 5\' 3"  (1.6 m)   Body mass index is 46.41 kg/m.   GENERAL: Alert and oriented. Appears well and in no acute distress.  HEENT: Atraumatic. Conjunctiva clear. No obvious abnormalities on inspection of external nose and ears.  NECK: Normal  movements of the head and neck.  LUNGS: On inspection, no signs of respiratory distress. Breathing rate appears normal. No obvious gross SOB, gasping or wheezing, and no conversational dyspnea.  CV: No obvious cyanosis.  PSYCH/NEURO: Pleasant and cooperative. No obvious depression or anxiety. Speech and thought processing grossly intact.  ASSESSMENT AND PLAN:  1. COVID-19 Reviewed home care instructions for COVID. Advised self-isolation at home for at least 5 days. After 5 days, if improved and fever resolved, can be in public, but should wear a mask around others for an additional 5 days. If symptoms, esp, dyspnea develops/worsens, recommend in-person evaluation at either an urgent care, emergency room, or [facility] [phone]). I will prescribe some Tessalon for cough. We discussed possible use of Paxlovid. She will hold off on that for now.  We discussed quarantine recommendations for Ms. Fosnaugh's daughter, who is negative so far.  - benzonatate (TESSALON) 100 MG capsule; Take 1 capsule (100 mg total) by mouth 2 (two) times daily as needed for cough.  Dispense: 20 capsule; Refill: 0   I discussed the assessment and treatment plan with the patient. The patient was provided an opportunity to ask questions and all were answered. The patient agreed with the plan and demonstrated an understanding of the instructions.   The patient was advised to call back or seek an in-person evaluation if the symptoms worsen or if the condition fails to improve as anticipated.   ([phone], MD

## 2020-07-27 NOTE — Patient Instructions (Signed)
COVID-19 Quarantine vs. Isolation QUARANTINE keeps someone who was in close contact with someone who has COVID-19 away from others. Quarantine if you have been in close contact with someone who has COVID-19, unless you have been fully vaccinated. If you are fully vaccinated  You do NOT need to quarantine unless they have symptoms  Get tested 3-5 days after your exposure, even if you don't have symptoms  Wear a mask indoors in public for 14 days following exposure or until your test result is negative If you are not fully vaccinated  Stay home for 14 days after your last contact with a person who has COVID-19  Watch for fever (100.4F), cough, shortness of breath, or other symptoms of COVID-19  If possible, stay away from people you live with, especially people who are at higher risk for getting very sick from COVID-19  Contact your local public health department for options in your area to possibly shorten your quarantine ISOLATION keeps someone who is sick or tested positive for COVID-19 without symptoms away from others, even in their own home. People who are in isolation should stay home and stay in a specific "sick room" or area and use a separate bathroom (if available). If you are sick and think or know you have COVID-19 Stay home until after  At least 10 days since symptoms first appeared and  At least 24 hours with no fever without the use of fever-reducing medications and  Symptoms have improved If you tested positive for COVID-19 but do not have symptoms  Stay home until after 10 days have passed since your positive viral test  If you develop symptoms after testing positive, follow the steps above for those who are sick cdc.gov/coronavirus 12/28/2019 This information is not intended to replace advice given to you by your health care provider. Make sure you discuss any questions you have with your health care provider. Document Revised: 02/01/2020 Document Reviewed:  02/01/2020 Elsevier Patient Education  2021 Elsevier Inc.  

## 2020-08-02 ENCOUNTER — Other Ambulatory Visit: Payer: Self-pay | Admitting: Family Medicine

## 2020-08-02 DIAGNOSIS — R5381 Other malaise: Secondary | ICD-10-CM

## 2020-08-02 DIAGNOSIS — Z1231 Encounter for screening mammogram for malignant neoplasm of breast: Secondary | ICD-10-CM

## 2020-08-25 ENCOUNTER — Encounter: Payer: Self-pay | Admitting: Family Medicine

## 2020-08-25 ENCOUNTER — Other Ambulatory Visit (HOSPITAL_COMMUNITY)
Admission: RE | Admit: 2020-08-25 | Discharge: 2020-08-25 | Disposition: A | Discharge status: Home / Self Care | Payer: Federal, State, Local not specified - PPO | Source: Ambulatory Visit | Attending: Family Medicine | Admitting: Family Medicine

## 2020-08-25 ENCOUNTER — Ambulatory Visit (INDEPENDENT_AMBULATORY_CARE_PROVIDER_SITE_OTHER): Payer: Federal, State, Local not specified - PPO | Admitting: Family Medicine

## 2020-08-25 ENCOUNTER — Other Ambulatory Visit: Payer: Self-pay

## 2020-08-25 VITALS — BP 135/80 | HR 85 | Ht 63.0 in | Wt 258.0 lb

## 2020-08-25 DIAGNOSIS — N920 Excessive and frequent menstruation with regular cycle: Secondary | ICD-10-CM | POA: Diagnosis not present

## 2020-08-25 DIAGNOSIS — Z113 Encounter for screening for infections with a predominantly sexual mode of transmission: Secondary | ICD-10-CM | POA: Insufficient documentation

## 2020-08-25 DIAGNOSIS — Z01419 Encounter for gynecological examination (general) (routine) without abnormal findings: Secondary | ICD-10-CM | POA: Diagnosis not present

## 2020-08-25 DIAGNOSIS — D061 Carcinoma in situ of exocervix: Secondary | ICD-10-CM | POA: Diagnosis not present

## 2020-08-25 DIAGNOSIS — R221 Localized swelling, mass and lump, neck: Secondary | ICD-10-CM

## 2020-08-25 DIAGNOSIS — R590 Localized enlarged lymph nodes: Secondary | ICD-10-CM

## 2020-08-25 NOTE — Progress Notes (Signed)
  Subjective:     Candice Arellano is a 45 y.o. female and is here for a comprehensive physical exam. The patient reports no problems. Has IUD in place and minimal bleeding. Daughter is graduating HS. Has a swelling under her arm which is new.  The following portions of the patient's history were reviewed and updated as appropriate: allergies, current medications, past family history, past medical history, past social history, past surgical history and problem list.  Review of Systems Pertinent items noted in HPI and remainder of comprehensive ROS otherwise negative.   Objective:    BP 135/80   Pulse 85   Ht 5\' 3"  (1.6 m)   Wt 258 lb (117 kg)   BMI 45.70 kg/m  General appearance: alert, cooperative and appears stated age Head: Normocephalic, without obvious abnormality, atraumatic Neck: assymetry noted on right with small swelling, no mass appreciated and thyroid feels normal. Lungs: clear to auscultation bilaterally Breasts: normal appearance, no masses or tenderness  Lymph: 1 x 1.5 cm firm, non-mobile swelling in right adnexa Heart: regular rate and rhythm Abdomen: soft, non-tender; bowel sounds normal; no masses,  no organomegaly Pelvic: external genitalia normal Extremities: extremities normal, atraumatic, no cyanosis or edema Pulses: 2+ and symmetric Skin: Skin color, texture, turgor normal. No rashes or lesions    Assessment:    Healthy female exam.      Plan:     Menorrhagia with regular cycle - Continue IUD  Carcinoma in situ of exocervix - Normal pap x 3 with negative HPV, following LEEP--return to normal screening  Encounter for gynecological examination without abnormal finding - Plan: CBC, Comprehensive metabolic panel, Hemoglobin A1c, CANCELED: MM 3D SCREEN BREAST BILATERAL  Screen for STD (sexually transmitted disease) - return for blood work, no today - Plan: Cervicovaginal ancillary only( [facility]), Hepatitis B surface antigen, Hepatitis C  antibody, HIV Antibody (routine testing w rflx), RPR  Neck mass - does not feel like thyroid, and no mass noted, but asymmetry noted--will check u/s - Plan: Designer, industrial/product SOFT TISSUE HEAD & NECK (NON-THYROID), TSH  Lymphadenopathy, axillary - change to diagnostic mammogram and add u/s--no breast mass noted. - Plan: MM DIAG BREAST TOMO BILATERAL, US BREAST LTD UNI RIGHT INC AXILLA  Return in 1 year (on 08/25/2021).   See After Visit Summary for Counseling Recommendations

## 2020-08-25 NOTE — Progress Notes (Signed)
mirena was placed 08/2014

## 2020-08-25 NOTE — Patient Instructions (Signed)
Preventive Care 11-43 Years Old, Female Preventive care refers to lifestyle choices and visits with your health care provider that can promote health and wellness. This includes:  A yearly physical exam. This is also called an annual wellness visit.  Regular dental and eye exams.  Immunizations.  Screening for certain conditions.  Healthy lifestyle choices, such as: ? Eating a healthy diet. ? Getting regular exercise. ? Not using drugs or products that contain nicotine and tobacco. ? Limiting alcohol use. What can I expect for my preventive care visit? Physical exam Your health care provider may check your:  Height and weight. These may be used to calculate your BMI (body mass index). BMI is a measurement that tells if you are at a healthy weight.  Heart rate and blood pressure.  Body temperature.  Skin for abnormal spots. Counseling Your health care provider may ask you questions about your:  Past medical problems.  Family's medical history.  Alcohol, tobacco, and drug use.  Emotional well-being.  Home life and relationship well-being.  Sexual activity.  Diet, exercise, and sleep habits.  Work and work Statistician.  Access to firearms.  Method of birth control.  Menstrual cycle.  Pregnancy history. What immunizations do I need? Vaccines are usually given at various ages, according to a schedule. Your health care provider will recommend vaccines for you based on your age, medical history, and lifestyle or other factors, such as travel or where you work.   What tests do I need? Blood tests  Lipid and cholesterol levels. These may be checked every 5 years starting at age 64.  Hepatitis C test.  Hepatitis B test. Screening  Diabetes screening. This is done by checking your blood sugar (glucose) after you have not eaten for a while (fasting).  STD (sexually transmitted disease) testing, if you are at risk.  BRCA-related cancer screening. This may  be done if you have a family history of breast, ovarian, tubal, or peritoneal cancers.  Pelvic exam and Pap test. This may be done every 3 years starting at age 61. Starting at age 82, this may be done every 5 years if you have a Pap test in combination with an HPV test. Talk with your health care provider about your test results, treatment options, and if necessary, the need for more tests.   Follow these instructions at home: Eating and drinking  Eat a healthy diet that includes fresh fruits and vegetables, whole grains, lean protein, and low-fat dairy products.  Take vitamin and mineral supplements as recommended by your health care provider.  Do not drink alcohol if: ? Your health care provider tells you not to drink. ? You are pregnant, may be pregnant, or are planning to become pregnant.  If you drink alcohol: ? Limit how much you have to 0-1 drink a day. ? Be aware of how much alcohol is in your drink. In the [place] one drink equals one 12 oz bottle of beer (355 mL), one 5 oz glass of wine (148 mL), or one 1 oz glass of hard liquor (44 mL).   Lifestyle  Take daily care of your teeth and gums. Brush your teeth every morning and night with fluoride toothpaste. Floss one time each day.  Stay active. Exercise for at least 30 minutes 5 or more days each week.  Do not use any products that contain nicotine or tobacco, such as cigarettes, e-cigarettes, and chewing tobacco. If you need help quitting, ask your health care provider.  Do  not use drugs.  If you are sexually active, practice safe sex. Use a condom or other form of protection to prevent STIs (sexually transmitted infections).  If you do not wish to become pregnant, use a form of birth control. If you plan to become pregnant, see your health care provider for a prepregnancy visit.  Find healthy ways to cope with stress, such as: ? Meditation, yoga, or listening to music. ? Journaling. ? Talking to a trusted  person. ? Spending time with friends and family. Safety  Always wear your seat belt while driving or riding in a vehicle.  Do not drive: ? If you have been drinking alcohol. Do not ride with someone who has been drinking. ? When you are tired or distracted. ? While texting.  Wear a helmet and other protective equipment during sports activities.  If you have firearms in your house, make sure you follow all gun safety procedures.  Seek help if you have been physically or sexually abused. What's next?  Go to your health care provider once a year for an annual wellness visit.  Ask your health care provider how often you should have your eyes and teeth checked.  Stay up to date on all vaccines. This information is not intended to replace advice given to you by your health care provider. Make sure you discuss any questions you have with your health care provider. Document Revised: 11/15/2019 Document Reviewed: 11/28/2017 Elsevier Patient Education  2021 [employer].

## 2020-08-26 LAB — CERVICOVAGINAL ANCILLARY ONLY
Chlamydia: NEGATIVE
Comment: NEGATIVE
Comment: NEGATIVE
Comment: NORMAL
Neisseria Gonorrhea: NEGATIVE
Trichomonas: NEGATIVE

## 2020-08-30 ENCOUNTER — Other Ambulatory Visit: Payer: Federal, State, Local not specified - PPO

## 2020-09-05 ENCOUNTER — Ambulatory Visit (HOSPITAL_COMMUNITY): Payer: Federal, State, Local not specified - PPO

## 2020-09-09 ENCOUNTER — Ambulatory Visit (HOSPITAL_COMMUNITY): Payer: Federal, State, Local not specified - PPO

## 2020-09-23 ENCOUNTER — Other Ambulatory Visit: Payer: Self-pay

## 2020-09-23 ENCOUNTER — Ambulatory Visit (HOSPITAL_COMMUNITY)
Admission: RE | Admit: 2020-09-23 | Discharge: 2020-09-23 | Disposition: A | Discharge status: Home / Self Care | Payer: Federal, State, Local not specified - PPO | Source: Ambulatory Visit | Attending: Family Medicine | Admitting: Family Medicine

## 2020-09-23 DIAGNOSIS — E041 Nontoxic single thyroid nodule: Secondary | ICD-10-CM | POA: Diagnosis not present

## 2020-09-23 DIAGNOSIS — R221 Localized swelling, mass and lump, neck: Secondary | ICD-10-CM | POA: Diagnosis not present

## 2020-10-04 ENCOUNTER — Other Ambulatory Visit: Payer: Self-pay | Admitting: Family Medicine

## 2020-10-04 ENCOUNTER — Ambulatory Visit
Admission: RE | Admit: 2020-10-04 | Discharge: 2020-10-04 | Disposition: A | Discharge status: Home / Self Care | Payer: Federal, State, Local not specified - PPO | Source: Ambulatory Visit | Attending: Family Medicine | Admitting: Family Medicine

## 2020-10-04 ENCOUNTER — Other Ambulatory Visit: Payer: Self-pay

## 2020-10-04 DIAGNOSIS — N644 Mastodynia: Secondary | ICD-10-CM | POA: Diagnosis not present

## 2020-10-04 DIAGNOSIS — R590 Localized enlarged lymph nodes: Secondary | ICD-10-CM

## 2020-10-04 DIAGNOSIS — N6489 Other specified disorders of breast: Secondary | ICD-10-CM | POA: Diagnosis not present

## 2020-10-04 DIAGNOSIS — R928 Other abnormal and inconclusive findings on diagnostic imaging of breast: Secondary | ICD-10-CM | POA: Diagnosis not present

## 2020-10-04 DIAGNOSIS — R922 Inconclusive mammogram: Secondary | ICD-10-CM | POA: Diagnosis not present

## 2020-10-14 ENCOUNTER — Other Ambulatory Visit: Payer: Self-pay

## 2020-10-14 ENCOUNTER — Ambulatory Visit
Admission: RE | Admit: 2020-10-14 | Discharge: 2020-10-14 | Disposition: A | Discharge status: Home / Self Care | Payer: Federal, State, Local not specified - PPO | Source: Ambulatory Visit | Attending: Family Medicine | Admitting: Family Medicine

## 2020-10-14 ENCOUNTER — Other Ambulatory Visit: Payer: Self-pay | Admitting: Family Medicine

## 2020-10-14 DIAGNOSIS — R590 Localized enlarged lymph nodes: Secondary | ICD-10-CM

## 2020-10-14 DIAGNOSIS — R2231 Localized swelling, mass and lump, right upper limb: Secondary | ICD-10-CM | POA: Diagnosis not present

## 2020-10-19 ENCOUNTER — Ambulatory Visit
Admission: RE | Admit: 2020-10-19 | Discharge: 2020-10-19 | Disposition: A | Discharge status: Home / Self Care | Payer: Federal, State, Local not specified - PPO | Source: Ambulatory Visit | Attending: Family Medicine | Admitting: Family Medicine

## 2020-10-19 ENCOUNTER — Other Ambulatory Visit: Payer: Self-pay

## 2020-10-19 ENCOUNTER — Other Ambulatory Visit: Payer: Self-pay | Admitting: Family Medicine

## 2020-10-19 DIAGNOSIS — L02411 Cutaneous abscess of right axilla: Secondary | ICD-10-CM | POA: Diagnosis not present

## 2020-10-19 DIAGNOSIS — R590 Localized enlarged lymph nodes: Secondary | ICD-10-CM

## 2020-10-19 DIAGNOSIS — N644 Mastodynia: Secondary | ICD-10-CM | POA: Diagnosis not present

## 2020-10-25 LAB — AEROBIC/ANAEROBIC CULTURE W GRAM STAIN (SURGICAL/DEEP WOUND)

## 2020-10-26 ENCOUNTER — Other Ambulatory Visit: Payer: Self-pay

## 2020-10-26 ENCOUNTER — Ambulatory Visit
Admission: RE | Admit: 2020-10-26 | Discharge: 2020-10-26 | Disposition: A | Discharge status: Home / Self Care | Payer: Federal, State, Local not specified - PPO | Source: Ambulatory Visit | Attending: Family Medicine | Admitting: Family Medicine

## 2020-10-26 ENCOUNTER — Other Ambulatory Visit: Payer: Self-pay | Admitting: Family Medicine

## 2020-10-26 DIAGNOSIS — R590 Localized enlarged lymph nodes: Secondary | ICD-10-CM

## 2020-10-26 DIAGNOSIS — L02411 Cutaneous abscess of right axilla: Secondary | ICD-10-CM | POA: Diagnosis not present

## 2020-10-27 ENCOUNTER — Other Ambulatory Visit: Payer: Self-pay | Admitting: Family Medicine

## 2020-10-27 DIAGNOSIS — N611 Abscess of the breast and nipple: Secondary | ICD-10-CM

## 2020-11-04 ENCOUNTER — Other Ambulatory Visit: Payer: Self-pay | Admitting: Family Medicine

## 2020-11-04 ENCOUNTER — Ambulatory Visit
Admission: RE | Admit: 2020-11-04 | Discharge: 2020-11-04 | Disposition: A | Discharge status: Home / Self Care | Payer: Federal, State, Local not specified - PPO | Source: Ambulatory Visit | Attending: Family Medicine | Admitting: Family Medicine

## 2020-11-04 ENCOUNTER — Other Ambulatory Visit: Payer: Self-pay

## 2020-11-04 DIAGNOSIS — M79621 Pain in right upper arm: Secondary | ICD-10-CM | POA: Diagnosis not present

## 2020-11-04 DIAGNOSIS — L02411 Cutaneous abscess of right axilla: Secondary | ICD-10-CM | POA: Diagnosis not present

## 2020-11-04 DIAGNOSIS — N611 Abscess of the breast and nipple: Secondary | ICD-10-CM

## 2020-11-24 ENCOUNTER — Ambulatory Visit: Payer: Federal, State, Local not specified - PPO | Admitting: Allergy

## 2020-12-01 ENCOUNTER — Ambulatory Visit: Payer: Federal, State, Local not specified - PPO | Admitting: Allergy

## 2021-05-11 ENCOUNTER — Other Ambulatory Visit: Payer: Self-pay | Admitting: *Deleted

## 2021-05-11 ENCOUNTER — Telehealth: Payer: Self-pay | Admitting: Family Medicine

## 2021-05-11 MED ORDER — FLUCONAZOLE 150 MG PO TABS: 150.0000 mg | ORAL_TABLET | Freq: Once | ORAL | 3 refills | Status: AC

## 2021-05-11 NOTE — Telephone Encounter (Signed)
Pt would like to see if her meds have been called into [facility]. Stated that they sent over a RX.   CB# [phone]

## 2021-05-11 NOTE — Telephone Encounter (Signed)
Patient is needing to get the diflucan pill called in

## 2021-10-25 ENCOUNTER — Other Ambulatory Visit: Payer: Self-pay | Admitting: *Deleted

## 2021-10-25 DIAGNOSIS — Z1231 Encounter for screening mammogram for malignant neoplasm of breast: Secondary | ICD-10-CM

## 2021-11-02 ENCOUNTER — Ambulatory Visit
Admission: RE | Admit: 2021-11-02 | Discharge: 2021-11-02 | Disposition: A | Discharge status: Home / Self Care | Payer: Federal, State, Local not specified - PPO | Source: Ambulatory Visit | Attending: Family Medicine | Admitting: Family Medicine

## 2021-11-02 DIAGNOSIS — Z1231 Encounter for screening mammogram for malignant neoplasm of breast: Secondary | ICD-10-CM

## 2022-01-03 ENCOUNTER — Ambulatory Visit (INDEPENDENT_AMBULATORY_CARE_PROVIDER_SITE_OTHER): Payer: Federal, State, Local not specified - PPO | Admitting: Family Medicine

## 2022-01-03 ENCOUNTER — Other Ambulatory Visit (HOSPITAL_COMMUNITY)
Admission: RE | Admit: 2022-01-03 | Discharge: 2022-01-03 | Disposition: A | Discharge status: Home / Self Care | Payer: Federal, State, Local not specified - PPO | Source: Ambulatory Visit | Attending: Family Medicine | Admitting: Family Medicine

## 2022-01-03 ENCOUNTER — Encounter: Payer: Self-pay | Admitting: Family Medicine

## 2022-01-03 VITALS — BP 138/85 | HR 82 | Ht 63.0 in | Wt 272.0 lb

## 2022-01-03 DIAGNOSIS — Z01419 Encounter for gynecological examination (general) (routine) without abnormal findings: Secondary | ICD-10-CM | POA: Diagnosis not present

## 2022-01-03 DIAGNOSIS — B354 Tinea corporis: Secondary | ICD-10-CM | POA: Diagnosis not present

## 2022-01-03 DIAGNOSIS — N920 Excessive and frequent menstruation with regular cycle: Secondary | ICD-10-CM | POA: Diagnosis not present

## 2022-01-03 DIAGNOSIS — Z124 Encounter for screening for malignant neoplasm of cervix: Secondary | ICD-10-CM | POA: Insufficient documentation

## 2022-01-03 DIAGNOSIS — Z1329 Encounter for screening for other suspected endocrine disorder: Secondary | ICD-10-CM | POA: Diagnosis not present

## 2022-01-03 DIAGNOSIS — Z13220 Encounter for screening for lipoid disorders: Secondary | ICD-10-CM | POA: Diagnosis not present

## 2022-01-03 DIAGNOSIS — Z113 Encounter for screening for infections with a predominantly sexual mode of transmission: Secondary | ICD-10-CM | POA: Diagnosis not present

## 2022-01-03 DIAGNOSIS — Z131 Encounter for screening for diabetes mellitus: Secondary | ICD-10-CM | POA: Diagnosis not present

## 2022-01-03 MED ORDER — NYSTATIN 100000 UNIT/GM EX CREA: 1.0000 | TOPICAL_CREAM | Freq: Two times a day (BID) | CUTANEOUS | 2 refills | Status: AC

## 2022-01-03 NOTE — Progress Notes (Signed)
Patient presents for Annual Exam.  LMP: 12/19/21 Contraception IUD inserted  08/06/2014  Last pap: 08/11/2019 WNL  Mammogram:11/03/21 WNL  No Family Hx of Breast Cancer  Flu Vaccine Declined. STD Screening: desires Full panel  CC: None

## 2022-01-03 NOTE — Patient Instructions (Signed)

## 2022-01-03 NOTE — Progress Notes (Signed)
Subjective:     Candice Arellano is a 46 y.o. female and is here for a comprehensive physical exam. The patient reports no problems. Regular cycle, but is skipping some. Reports nightly hot flashes. IUD in place x 7 years. Works well. She is s/p SVD x 2, c-section x 1 and BTL. Desires STD screen and refill of her Nystatin for occasional fungal infection under her breasts.  The following portions of the patient's history were reviewed and updated as appropriate: allergies, current medications, past family history, past medical history, past social history, past surgical history, and problem list.  Review of Systems Pertinent items noted in HPI and remainder of comprehensive ROS otherwise negative.   Objective:    BP 138/85   Pulse 82   Ht 5\' 3"  (1.6 m)   Wt 272 lb (123.4 kg)   BMI 48.18 kg/m  General appearance: alert, cooperative, and appears stated age Head: Normocephalic, without obvious abnormality, atraumatic Neck: no adenopathy, supple, symmetrical, trachea midline, and thyroid not enlarged, symmetric, no tenderness/mass/nodules Lungs: clear to auscultation bilaterally Breasts: normal appearance, no masses or tenderness Heart: regular rate and rhythm, S1, S2 normal, no murmur, click, rub or gallop Abdomen: soft, non-tender; bowel sounds normal; no masses,  no organomegaly Pelvic: cervix normal in appearance, external genitalia normal, no adnexal masses or tenderness, no cervical motion tenderness, uterus normal size, shape, and consistency, vagina normal without discharge, and IUD strings noted Extremities: extremities normal, atraumatic, no cyanosis or edema Pulses: 2+ and symmetric Skin: Skin color, texture, turgor normal. No rashes or lesions Lymph nodes: Cervical, supraclavicular, and axillary nodes normal. Neurologic: Grossly normal    Assessment:    GYN female exam.      Plan:  Screening for malignant neoplasm of cervix - 54008 - Plan: Cytology - PAP( [facility])  Encounter for gynecological examination without abnormal finding - 67619 - mammogram is up to date--will need colon cancer screening next year. - Plan: CBC, TSH, Comprehensive metabolic panel, Hemoglobin A1c, Lipid panel  Screen for STD (sexually transmitted disease) - 50932 - Plan: Hepatitis B surface antigen, Hepatitis C antibody, HIV Antibody (routine testing w rflx), RPR  Menorrhagia with regular cycle - 67124 - on her IUD, good for one more year--can replace in office. Discussed possibility of hysterectomy or ablation. She will consider options.  Tinea corporis - [phone] - refill of her Nystatin - Plan: nystatin cream (MYCOSTATIN)    See After Visit Summary for Counseling Recommendations

## 2022-01-04 LAB — COMPREHENSIVE METABOLIC PANEL WITH GFR
ALT: 11 IU/L (ref 0–32)
AST: 15 IU/L (ref 0–40)
Albumin/Globulin Ratio: 1.3 (ref 1.2–2.2)
Albumin: 4.5 g/dL (ref 3.9–4.9)
Alkaline Phosphatase: 73 IU/L (ref 44–121)
BUN/Creatinine Ratio: 12 (ref 9–23)
BUN: 10 mg/dL (ref 6–24)
Bilirubin Total: 0.3 mg/dL (ref 0.0–1.2)
CO2: 21 mmol/L (ref 20–29)
Calcium: 9.4 mg/dL (ref 8.7–10.2)
Chloride: 99 mmol/L (ref 96–106)
Creatinine, Ser: 0.83 mg/dL (ref 0.57–1.00)
Globulin, Total: 3.5 g/dL (ref 1.5–4.5)
Glucose: 85 mg/dL (ref 70–99)
Potassium: 3.6 mmol/L (ref 3.5–5.2)
Sodium: 138 mmol/L (ref 134–144)
Total Protein: 8 g/dL (ref 6.0–8.5)
eGFR: 89 mL/min/1.73

## 2022-01-04 LAB — CBC
Hematocrit: 35.7 % (ref 34.0–46.6)
Hemoglobin: 11.4 g/dL (ref 11.1–15.9)
MCH: 23.6 pg — ABNORMAL LOW (ref 26.6–33.0)
MCHC: 31.9 g/dL (ref 31.5–35.7)
MCV: 74 fL — ABNORMAL LOW (ref 79–97)
Platelets: 361 10*3/uL (ref 150–450)
RBC: 4.84 x10E6/uL (ref 3.77–5.28)
RDW: 14.4 % (ref 11.7–15.4)
WBC: 8.5 10*3/uL (ref 3.4–10.8)

## 2022-01-04 LAB — HEPATITIS B SURFACE ANTIGEN: Hepatitis B Surface Ag: NEGATIVE

## 2022-01-04 LAB — TSH: TSH: 1.2 u[IU]/mL (ref 0.450–4.500)

## 2022-01-04 LAB — HEMOGLOBIN A1C
Est. average glucose Bld gHb Est-mCnc: 114 mg/dL
Hgb A1c MFr Bld: 5.6 % (ref 4.8–5.6)

## 2022-01-04 LAB — LIPID PANEL
Chol/HDL Ratio: 3.2 ratio (ref 0.0–4.4)
Cholesterol, Total: 153 mg/dL (ref 100–199)
HDL: 48 mg/dL (ref >39)
LDL Chol Calc (NIH): 86 mg/dL (ref 0–99)
Triglycerides: 106 mg/dL (ref 0–149)
VLDL Cholesterol Cal: 19 mg/dL (ref 5–40)

## 2022-01-04 LAB — HIV ANTIBODY (ROUTINE TESTING W REFLEX): HIV Screen 4th Generation wRfx: NONREACTIVE

## 2022-01-04 LAB — RPR: RPR Ser Ql: NONREACTIVE

## 2022-01-04 LAB — HEPATITIS C ANTIBODY: Hep C Virus Ab: NONREACTIVE

## 2022-01-08 LAB — CYTOLOGY - PAP
Chlamydia: NEGATIVE
Comment: NEGATIVE
Comment: NEGATIVE
Comment: NORMAL
Diagnosis: NEGATIVE
High risk HPV: NEGATIVE
Neisseria Gonorrhea: NEGATIVE

## 2022-03-17 ENCOUNTER — Encounter: Payer: Self-pay | Admitting: Family Medicine

## 2022-03-20 MED ORDER — VALACYCLOVIR HCL 1 G PO TABS: 1000.0000 mg | ORAL_TABLET | Freq: Two times a day (BID) | ORAL | 2 refills | Status: AC

## 2022-03-28 ENCOUNTER — Ambulatory Visit: Payer: Federal, State, Local not specified - PPO | Admitting: Family Medicine

## 2022-05-16 ENCOUNTER — Telehealth: Payer: Self-pay | Admitting: Nurse Practitioner

## 2022-05-16 NOTE — Telephone Encounter (Signed)
Called because patient hasn't been seen since 11/23/19 with Candice Arellano and 07/27/20 with Dr Gena Fray for an acute visit. Lvm to see if patient is still seeing Candice Arellano or if we need to update PCP, and if she is to schedule an appt

## 2022-06-05 ENCOUNTER — Encounter: Payer: Self-pay | Admitting: Family Medicine

## 2022-06-14 ENCOUNTER — Ambulatory Visit: Payer: Federal, State, Local not specified - PPO | Admitting: Family Medicine

## 2022-06-14 ENCOUNTER — Encounter: Payer: Self-pay | Admitting: Family Medicine

## 2022-06-14 VITALS — BP 125/84 | HR 80 | Wt 273.0 lb

## 2022-06-14 DIAGNOSIS — N920 Excessive and frequent menstruation with regular cycle: Secondary | ICD-10-CM

## 2022-06-14 DIAGNOSIS — Z113 Encounter for screening for infections with a predominantly sexual mode of transmission: Secondary | ICD-10-CM

## 2022-06-14 MED ORDER — NORETHINDRONE ACETATE 5 MG PO TABS: 5.0000 mg | ORAL_TABLET | Freq: Every day | ORAL | 1 refills | Status: DC

## 2022-06-14 NOTE — Assessment & Plan Note (Signed)
Desires endometrial ablation. She has had a BTL. Add aygestin until procedure. Will pull IUD at same time.  Risks include but are not limited to bleeding, infection, injury to surrounding structures, including bowel, bladder and ureters, blood clots, and death.  Likelihood of success is high.

## 2022-06-14 NOTE — Progress Notes (Signed)
   Subjective:    Patient ID: Candice Arellano is a 47 y.o. female presenting with Contraception  on 06/14/2022  HPI: Patinet is s/p BTL. She has had an IUD in for 8 years. Has been having more heavier cycles lately. Sent in photos of ulcer in December.  Review of Systems  Constitutional:  Negative for chills and fever.  Respiratory:  Negative for shortness of breath.   Cardiovascular:  Negative for chest pain.  Gastrointestinal:  Negative for abdominal pain, nausea and vomiting.  Genitourinary:  Negative for dysuria.  Skin:  Negative for rash.      Objective:    BP 125/84   Pulse 80   Wt 273 lb (123.8 kg)   BMI 48.36 kg/m  Physical Exam Exam conducted with a chaperone present.  Constitutional:      General: She is not in acute distress.    Appearance: She is well-developed.  HENT:     Head: Normocephalic and atraumatic.  Eyes:     General: No scleral icterus. Cardiovascular:     Rate and Rhythm: Normal rate.  Pulmonary:     Effort: Pulmonary effort is normal.  Abdominal:     Palpations: Abdomen is soft.  Musculoskeletal:     Cervical back: Neck supple.  Skin:    General: Skin is warm and dry.  Neurological:     Mental Status: She is alert and oriented to person, place, and time.         Assessment & Plan:   Problem List Items Addressed This Visit       Unprioritized   Menorrhagia - Primary    Desires endometrial ablation. She has had a BTL. Add aygestin until procedure. Will pull IUD at same time.  Risks include but are not limited to bleeding, infection, injury to surrounding structures, including bowel, bladder and ureters, blood clots, and death.  Likelihood of success is high.       Relevant Medications   norethindrone (AYGESTIN) 5 MG tablet   Other Visit Diagnoses     Screen for STD (sexually transmitted disease)       given ulceration in December--check for HSV antibodies and RPR.   Relevant Orders   RPR   HIV Antibody (routine testing w  rflx)   HSV 1 and 2 Ab, IgG       Return in about 3 months (around 09/14/2022) for postop check.  Donnamae Jude, MD 06/14/2022 3:34 PM

## 2022-06-14 NOTE — Progress Notes (Signed)
CC: Discuss ablation  and IUD removal

## 2022-06-15 LAB — HSV 1 AND 2 AB, IGG
HSV 1 Glycoprotein G Ab, IgG: 29.3 index — ABNORMAL HIGH (ref 0.00–0.90)
HSV 2 IgG, Type Spec: >23.6 index — ABNORMAL HIGH (ref 0.00–0.90)

## 2022-06-15 LAB — HIV ANTIBODY (ROUTINE TESTING W REFLEX): HIV Screen 4th Generation wRfx: NONREACTIVE

## 2022-06-15 LAB — RPR: RPR Ser Ql: NONREACTIVE

## 2022-07-20 ENCOUNTER — Other Ambulatory Visit: Payer: Self-pay

## 2022-07-20 ENCOUNTER — Encounter (HOSPITAL_BASED_OUTPATIENT_CLINIC_OR_DEPARTMENT_OTHER): Payer: Self-pay | Admitting: Family Medicine

## 2022-07-20 ENCOUNTER — Encounter: Payer: Self-pay | Admitting: Nurse Practitioner

## 2022-07-20 ENCOUNTER — Ambulatory Visit (INDEPENDENT_AMBULATORY_CARE_PROVIDER_SITE_OTHER): Payer: Federal, State, Local not specified - PPO | Admitting: Nurse Practitioner

## 2022-07-20 VITALS — BP 132/84 | HR 72 | Temp 97.8°F | Ht 63.0 in | Wt 272.0 lb

## 2022-07-20 DIAGNOSIS — Z136 Encounter for screening for cardiovascular disorders: Secondary | ICD-10-CM | POA: Diagnosis not present

## 2022-07-20 DIAGNOSIS — Z1211 Encounter for screening for malignant neoplasm of colon: Secondary | ICD-10-CM

## 2022-07-20 DIAGNOSIS — Z23 Encounter for immunization: Secondary | ICD-10-CM

## 2022-07-20 DIAGNOSIS — Z0000 Encounter for general adult medical examination without abnormal findings: Secondary | ICD-10-CM | POA: Diagnosis not present

## 2022-07-20 DIAGNOSIS — Z13220 Encounter for screening for lipoid disorders: Secondary | ICD-10-CM

## 2022-07-20 LAB — LIPID PANEL
Cholesterol: 145 mg/dL (ref 0–200)
HDL: 36.8 mg/dL — ABNORMAL LOW (ref >39.00)
LDL Cholesterol: 95 mg/dL (ref 0–99)
NonHDL: 108.34
Total CHOL/HDL Ratio: 4
Triglycerides: 69 mg/dL (ref 0.0–149.0)
VLDL: 13.8 mg/dL (ref 0.0–40.0)

## 2022-07-20 LAB — COMPREHENSIVE METABOLIC PANEL
ALT: 22 U/L (ref 0–35)
AST: 18 U/L (ref 0–37)
Albumin: 4.1 g/dL (ref 3.5–5.2)
Alkaline Phosphatase: 47 U/L (ref 39–117)
BUN: 9 mg/dL (ref 6–23)
CO2: 28 mEq/L (ref 19–32)
Calcium: 9.2 mg/dL (ref 8.4–10.5)
Chloride: 103 mEq/L (ref 96–112)
Creatinine, Ser: 0.78 mg/dL (ref 0.40–1.20)
GFR: 91.88 mL/min (ref >60.00)
Glucose, Bld: 85 mg/dL (ref 70–99)
Potassium: 3.8 mEq/L (ref 3.5–5.1)
Sodium: 139 mEq/L (ref 135–145)
Total Bilirubin: 0.5 mg/dL (ref 0.2–1.2)
Total Protein: 7.6 g/dL (ref 6.0–8.3)

## 2022-07-20 NOTE — Progress Notes (Signed)
Complete physical exam  Patient: Candice Arellano   DOB: [birth year]   47 y.o. Female  MRN: [id number] Visit Date: 07/20/2022  Subjective:    Chief Complaint  Patient presents with   Annual Exam    Fasting. Patient would like to discuss colonoscopy options    Candice Arellano is a 47 y.o. female who presents today for a complete physical exam. She reports consuming a general diet.  No exercise regimen  She generally feels well. She reports sleeping well. She does not have additional problems to discuss today.  Vision:Yes Dental:Yes STD Screen:No  BP Readings from Last 3 Encounters:  07/20/22 132/84  06/14/22 125/84  01/03/22 138/85   Wt Readings from Last 3 Encounters:  07/20/22 272 lb (123.4 kg)  06/14/22 273 lb (123.8 kg)  01/03/22 272 lb (123.4 kg)   Most recent fall risk assessment:    07/20/2022    1:19 PM  Fall Risk   Falls in the past year? 0  Number falls in past yr: 0  Injury with Fall? 0   Depression screen:Yes - No Depression  Most recent depression screenings:    07/20/2022    2:11 PM 11/23/2019    1:32 PM  PHQ 2/9 Scores  PHQ - 2 Score 0 0  PHQ- 9 Score 3    HPI  No problem-specific Assessment & Plan notes found for this encounter.  Past Medical History:  Diagnosis Date   Abnormal Pap smear of cervix 2013   colpo was negative per patient   Abnormal uterine bleeding (AUB)    Anemia    Carpal tunnel syndrome on both sides    Chicken pox    as child   CIN II (cervical intraepithelial neoplasia II)    s/p leep 2018   GERD (gastroesophageal reflux disease)    Gestational hypertension    Headache    History of COVID-19 07/2020   03-2022   history of depression    Wears glasses    Wears partial dentures    upper   Past Surgical History:  Procedure Laterality Date   CESAREAN SECTION  2004   LEEP  2018   TENDON REPAIR Left 1992   left thumb   TUBAL LIGATION  2004   Social History   Socioeconomic History   Marital status: Divorced     Spouse name: Not on file   Number of children: 2   Years of education: Not on file   Highest education level: Not on file  Occupational History   Occupation: unemployed  Tobacco Use   Smoking status: Never    Passive exposure: Never   Smokeless tobacco: Never  Vaping Use   Vaping Use: Never used  Substance and Sexual Activity   Alcohol use: Yes    Alcohol/week: 1.0 standard drink of alcohol    Types: 1 Glasses of wine per week    Comment: occasionally   Drug use: No   Sexual activity: Yes    Partners: Male    Birth control/protection: Surgical, I.U.D.    Comment: tubal ligation, IUD  Other Topics Concern   Not on file  Social History Narrative   Lives with parents and daughter and son. In a relationship for past year and a half.  Divorced.  Unemployed.  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   Social Determinants of Health   Financial  Resource Strain: Not on file  Food Insecurity: Not on file  Transportation Needs: Not on file  Physical Activity: Not on file  Stress: Not on file  Social Connections: Not on file  Intimate Partner Violence: Not on file   Family Status  Relation Name Status   Mother  Alive   Father  Alive   Brother  Alive   Daughter  Alive   MGM  Deceased   MGF  Deceased   PGM  Deceased   PGF  Deceased   Neg Hx  (Not Specified)   Family History  Problem Relation Age of Onset   Heart disease Mother    Hypertension Mother    Arthritis Mother        OA   Heart disease Father    Hypertension Father    Asthma Father    Diabetes Father    Asthma Daughter    Arthritis Maternal Grandmother    Diabetes Maternal Grandmother    Hypertension Maternal Grandmother    Cancer Maternal Grandmother 45       pancreatic   Kidney disease Maternal Grandfather        unknown cause   Heart disease Paternal Grandmother 25       CABG, MI   Arthritis Paternal  Grandmother    Heart attack Paternal Grandfather 12       MI cause of death   Stroke Neg Hx    Depression Neg Hx    Alcohol abuse Neg Hx    Drug abuse Neg Hx    Breast cancer Neg Hx    Allergies  Allergen Reactions   Latex Itching   Shellfish Allergy Swelling    Patient Care Team: Tianna Baus, Bonna Gains, NP as PCP - General (Internal Medicine)   Medications: Outpatient Medications Prior to Visit  Medication Sig   ASHWAGANDHA PO Take by mouth as needed. gummy   fluconazole (DIFLUCAN) 150 MG tablet Take by mouth as needed.   ibuprofen (ADVIL) 400 MG tablet Take 400 mg by mouth every 6 (six) hours as needed. Takes 2 of 200 mg   Lactobacillus (PROBIOTIC ACIDOPHILUS PO) Take 1 tablet by mouth daily.   levonorgestrel (MIRENA) 20 MCG/24HR IUD 1 each by Intrauterine route once. Inserted 2016   Multiple Vitamin (MULTIVITAMIN) tablet Take 1 tablet by mouth daily.   norethindrone (AYGESTIN) 5 MG tablet Take 1 tablet (5 mg total) by mouth daily. (Patient taking differently: Take 5 mg by mouth at bedtime.)   nystatin cream (MYCOSTATIN) Apply 1 Application topically 2 (two) times daily. (Patient taking differently: Apply 1 Application topically as needed.)   OVER THE COUNTER MEDICATION Elderberry gummy with vit d and vit c 2 per day   Azelastine-Fluticasone (DYMISTA) 137-50 MCG/ACT SUSP Place 1 spray into the nose in the morning and at bedtime. (Patient not taking: Reported on 08/25/2020)   No facility-administered medications prior to visit.    Review of Systems  Constitutional:  Negative for activity change, appetite change and unexpected weight change.  Respiratory: Negative.    Cardiovascular: Negative.   Gastrointestinal: Negative.   Endocrine: Negative for cold intolerance and heat intolerance.  Genitourinary: Negative.   Musculoskeletal: Negative.   Skin: Negative.   Neurological: Negative.   Hematological: Negative.   Psychiatric/Behavioral:  Negative for behavioral problems,  decreased concentration, dysphoric mood, hallucinations, self-injury, sleep disturbance and suicidal ideas. The patient is not nervous/anxious.         Objective:  BP 132/84 (BP Location: Right Arm, Patient Position:  Sitting, Cuff Size: Large)   Pulse 72   Temp 97.8 F (36.6 C) (Temporal)   Ht 5\' 3"  (1.6 m)   Wt 272 lb (123.4 kg)   LMP 07/02/2022   SpO2 99%   BMI 48.18 kg/m     Physical Exam Vitals and nursing note reviewed.  Constitutional:      General: She is not in acute distress. HENT:     Right Ear: Tympanic membrane, ear canal and external ear normal.     Left Ear: Tympanic membrane, ear canal and external ear normal.     Nose: Nose normal.  Eyes:     Extraocular Movements: Extraocular movements intact.     Conjunctiva/sclera: Conjunctivae normal.     Pupils: Pupils are equal, round, and reactive to light.  Neck:     Thyroid: No thyroid mass, thyromegaly or thyroid tenderness.  Cardiovascular:     Rate and Rhythm: Normal rate and regular rhythm.     Pulses: Normal pulses.     Heart sounds: Normal heart sounds.  Pulmonary:     Effort: Pulmonary effort is normal.     Breath sounds: Normal breath sounds.  Abdominal:     General: Bowel sounds are normal.     Palpations: Abdomen is soft.  Musculoskeletal:        General: Normal range of motion.     Cervical back: Normal range of motion and neck supple.     Right lower leg: No edema.     Left lower leg: No edema.  Lymphadenopathy:     Cervical: No cervical adenopathy.  Skin:    General: Skin is warm and dry.  Neurological:     Mental Status: She is alert and oriented to person, place, and time.     Cranial Nerves: No cranial nerve deficit.  Psychiatric:        Mood and Affect: Mood normal.        Behavior: Behavior normal.        Thought Content: Thought content normal.      No results found for any visits on 07/20/22.    Assessment & Plan:    Routine Health Maintenance and Physical  Exam  Immunization History  Administered Date(s) Administered   Influenza Split 01/12/2011, 02/01/2016   Influenza,inj,Quad PF,6+ Mos 05/03/2014, 03/02/2015   PFIZER(Purple Top)SARS-COV-2 Vaccination 08/24/2019, 09/14/2019   Tdap 01/12/2011, 07/20/2022   Health Maintenance  Topic Date Due   COLONOSCOPY (Pts 45-55yrs Insurance coverage will need to be confirmed)  Never done   COVID-19 Vaccine (3 - 2023-24 season) 08/05/2022 (Originally 12/01/2021)   INFLUENZA VACCINE  11/01/2022   PAP SMEAR-Modifier  01/03/2025   DTaP/Tdap/Td (3 - Td or Tdap) 07/19/2032   Hepatitis C Screening  Completed   HIV Screening  Completed   HPV VACCINES  Aged Out   Discussed health benefits of physical activity, and encouraged her to engage in regular exercise appropriate for her age and condition.  Problem List Items Addressed This Visit   None Visit Diagnoses     Preventative health care    -  Primary   Relevant Orders   Ambulatory referral to Gastroenterology   Lipid panel   Comprehensive metabolic panel   Immunization due       Relevant Orders   Tdap vaccine greater than or equal to 7yo IM (Completed)   Encounter for lipid screening for cardiovascular disease       Relevant Orders   Lipid panel   Colon cancer screening  Relevant Orders   Ambulatory referral to Gastroenterology      Return in about 1 year (around 07/20/2023) for CPE (fasting).     Alysia Penna, NP

## 2022-07-20 NOTE — Progress Notes (Addendum)
Spoke w/ via phone for pre-op interview---pt Lab needs dos----   urine preg , surgery orders requested dr Shawnie Pons epic ib           Lab results------none COVID test -----patient states asymptomatic no test needed Arrive at -------1245 pm 08-06-2022 NPO after MN NO Solid Food.  Clear liquids from MN until---1145 am Med rec completed Medications to take morning of surgery -----none Diabetic medication -----n/a Patient instructed no nail polish to be worn day of surgery Patient instructed to bring photo id and insurance card day of surgery Patient aware to have Driver (ride ) / caregiver  Laurita Quint daughter   for 24 hours after surgery  Patient Special Instructions -----none Pre-Op special Instructions -----none Patient verbalized understanding of instructions that were given at this phone interview. Patient denies shortness of breath, chest pain, fever, cough at this phone interview.

## 2022-07-20 NOTE — Patient Instructions (Addendum)
Centrum or nature made or alive multivitamin 1tab daily Go to lab Maintain Heart healthy diet and daily exercise.  Exercising to Stay Healthy To become healthy and stay healthy, it is recommended that you do moderate-intensity and vigorous-intensity exercise. You can tell that you are exercising at a moderate intensity if your heart starts beating faster and you start breathing faster but can still hold a conversation. You can tell that you are exercising at a vigorous intensity if you are breathing much harder and faster and cannot hold a conversation while exercising. How can exercise benefit me? Exercising regularly is important. It has many health benefits, such as: Improving overall fitness, flexibility, and endurance. Increasing bone density. Helping with weight control. Decreasing body fat. Increasing muscle strength and endurance. Reducing stress and tension, anxiety, depression, or anger. Improving overall health. What guidelines should I follow while exercising? Before you start a new exercise program, talk with your health care provider. Do not exercise so much that you hurt yourself, feel dizzy, or get very short of breath. Wear comfortable clothes and wear shoes with good support. Drink plenty of water while you exercise to prevent dehydration or heat stroke. Work out until your breathing and your heartbeat get faster (moderate intensity). How often should I exercise? Choose an activity that you enjoy, and set realistic goals. Your health care provider can help you make an activity plan that is individually designed and works best for you. Exercise regularly as told by your health care provider. This may include: Doing strength training two times a week, such as: Lifting weights. Using resistance bands. Push-ups. Sit-ups. Yoga. Doing a certain intensity of exercise for a given amount of time. Choose from these options: A total of 150 minutes of moderate-intensity exercise  every week. A total of 75 minutes of vigorous-intensity exercise every week. A mix of moderate-intensity and vigorous-intensity exercise every week. Children, pregnant women, people who have not exercised regularly, people who are overweight, and older adults may need to talk with a health care provider about what activities are safe to perform. If you have a medical condition, be sure to talk with your health care provider before you start a new exercise program. What are some exercise ideas? Moderate-intensity exercise ideas include: Walking 1 mile (1.6 km) in about 15 minutes. Biking. Hiking. Golfing. Dancing. Water aerobics. Vigorous-intensity exercise ideas include: Walking 4.5 miles (7.2 km) or more in about 1 hour. Jogging or running 5 miles (8 km) in about 1 hour. Biking 10 miles (16.1 km) or more in about 1 hour. Lap swimming. Roller-skating or in-line skating. Cross-country skiing. Vigorous competitive sports, such as football, basketball, and soccer. Jumping rope. Aerobic dancing. What are some everyday activities that can help me get exercise? Yard work, such as: Child psychotherapist. Raking and bagging leaves. Washing your car. Pushing a stroller. Shoveling snow. Gardening. Washing windows or floors. How can I be more active in my day-to-day activities? Use stairs instead of an elevator. Take a walk during your lunch break. If you drive, park your car farther away from your work or school. If you take public transportation, get off one stop early and walk the rest of the way. Stand up or walk around during all of your indoor phone calls. Get up, stretch, and walk around every 30 minutes throughout the day. Enjoy exercise with a friend. Support to continue exercising will help you keep a regular routine of activity. Where to find more information You can find more information  about exercising to stay healthy from: U.S. Department of Health and Human Services:  ThisPath.fi Centers for Disease Control and Prevention (CDC): FootballExhibition.com.br Summary Exercising regularly is important. It will improve your overall fitness, flexibility, and endurance. Regular exercise will also improve your overall health. It can help you control your weight, reduce stress, and improve your bone density. Do not exercise so much that you hurt yourself, feel dizzy, or get very short of breath. Before you start a new exercise program, talk with your health care provider. This information is not intended to replace advice given to you by your health care provider. Make sure you discuss any questions you have with your health care provider. Document Revised: 07/15/2020 Document Reviewed: 07/15/2020 Elsevier Patient Education  2023 Elsevier Inc.   Calorie Counting for [employer] Calories are units of energy. Your body needs a certain number of calories from food to keep going throughout the day. When you eat or drink more calories than your body needs, your body stores the extra calories mostly as fat. When you eat or drink fewer calories than your body needs, your body burns fat to get the energy it needs. Calorie counting means keeping track of how many calories you eat and drink each day. Calorie counting can be helpful if you need to lose weight. If you eat fewer calories than your body needs, you should lose weight. Ask your health care provider what a healthy weight is for you. For calorie counting to work, you will need to eat the right number of calories each day to lose a healthy amount of weight per week. A dietitian can help you figure out how many calories you need in a day and will suggest ways to reach your calorie goal. A healthy amount of weight to lose each week is usually 1-2 lb (0.5-0.9 kg). This usually means that your daily calorie intake should be reduced by 500-750 calories. Eating 1,200-1,500 calories a day can help most women lose weight. Eating 1,500-1,800  calories a day can help most men lose weight. What do I need to know about calorie counting? Work with your health care provider or dietitian to determine how many calories you should get each day. To meet your daily calorie goal, you will need to: Find out how many calories are in each food that you would like to eat. Try to do this before you eat. Decide how much of the food you plan to eat. Keep a food log. Do this by writing down what you ate and how many calories it had. To successfully lose weight, it is important to balance calorie counting with a healthy lifestyle that includes regular activity. Where do I find calorie information?  The number of calories in a food can be found on a Nutrition Facts label. If a food does not have a Nutrition Facts label, try to look up the calories online or ask your dietitian for help. Remember that calories are listed per serving. If you choose to have more than one serving of a food, you will have to multiply the calories per serving by the number of servings you plan to eat. For example, the label on a package of bread might say that a serving size is 1 slice and that there are 90 calories in a serving. If you eat 1 slice, you will have eaten 90 calories. If you eat 2 slices, you will have eaten 180 calories. How do I keep a food log? After each time  that you eat, record the following in your food log as soon as possible: What you ate. Be sure to include toppings, sauces, and other extras on the food. How much you ate. This can be measured in cups, ounces, or number of items. How many calories were in each food and drink. The total number of calories in the food you ate. Keep your food log near you, such as in a pocket-sized notebook or on an app or website on your mobile phone. Some programs will calculate calories for you and show you how many calories you have left to meet your daily goal. What are some portion-control tips? Know how many calories  are in a serving. This will help you know how many servings you can have of a certain food. Use a measuring cup to measure serving sizes. You could also try weighing out portions on a kitchen scale. With time, you will be able to estimate serving sizes for some foods. Take time to put servings of different foods on your favorite plates or in your favorite bowls and cups so you know what a serving looks like. Try not to eat straight from a food's packaging, such as from a bag or box. Eating straight from the package makes it hard to see how much you are eating and can lead to overeating. Put the amount you would like to eat in a cup or on a plate to make sure you are eating the right portion. Use smaller plates, glasses, and bowls for smaller portions and to prevent overeating. Try not to multitask. For example, avoid watching TV or using your computer while eating. If it is time to eat, sit down at a table and enjoy your food. This will help you recognize when you are full. It will also help you be more mindful of what and how much you are eating. What are tips for following this plan? Reading food labels Check the calorie count compared with the serving size. The serving size may be smaller than what you are used to eating. Check the source of the calories. Try to choose foods that are high in protein, fiber, and vitamins, and low in saturated fat, trans fat, and sodium. Shopping Read nutrition labels while you shop. This will help you make healthy decisions about which foods to buy. Pay attention to nutrition labels for low-fat or fat-free foods. These foods sometimes have the same number of calories or more calories than the full-fat versions. They also often have added sugar, starch, or salt to make up for flavor that was removed with the fat. Make a grocery list of lower-calorie foods and stick to it. Cooking Try to cook your favorite foods in a healthier way. For example, try baking instead of  frying. Use low-fat dairy products. Meal planning Use more fruits and vegetables. One-half of your plate should be fruits and vegetables. Include lean proteins, such as chicken, [place], and fish. Lifestyle Each week, aim to do one of the following: 150 minutes of moderate exercise, such as walking. 75 minutes of vigorous exercise, such as running. General information Know how many calories are in the foods you eat most often. This will help you calculate calorie counts faster. Find a way of tracking calories that works for you. Get creative. Try different apps or programs if writing down calories does not work for you. What foods should I eat?  Eat nutritious foods. It is better to have a nutritious, high-calorie food, such as an  avocado, than a food with few nutrients, such as a bag of potato chips. Use your calories on foods and drinks that will fill you up and will not leave you hungry soon after eating. Examples of foods that fill you up are nuts and nut butters, vegetables, lean proteins, and high-fiber foods such as whole grains. High-fiber foods are foods with more than 5 g of fiber per serving. Pay attention to calories in drinks. Low-calorie drinks include water and unsweetened drinks. The items listed above may not be a complete list of foods and beverages you can eat. Contact a dietitian for more information. What foods should I limit? Limit foods or drinks that are not good sources of vitamins, minerals, or protein or that are high in unhealthy fats. These include: Candy. Other sweets. Sodas, specialty coffee drinks, alcohol, and juice. The items listed above may not be a complete list of foods and beverages you should avoid. Contact a dietitian for more information. How do I count calories when eating out? Pay attention to portions. Often, portions are much larger when eating out. Try these tips to keep portions smaller: Consider sharing a meal instead of getting your own. If  you get your own meal, eat only half of it. Before you start eating, ask for a container and put half of your meal into it. When available, consider ordering smaller portions from the menu instead of full portions. Pay attention to your food and drink choices. Knowing the way food is cooked and what is included with the meal can help you eat fewer calories. If calories are listed on the menu, choose the lower-calorie options. Choose dishes that include vegetables, fruits, whole grains, low-fat dairy products, and lean proteins. Choose items that are boiled, broiled, grilled, or steamed. Avoid items that are buttered, battered, fried, or served with cream sauce. Items labeled as crispy are usually fried, unless stated otherwise. Choose water, low-fat milk, unsweetened iced tea, or other drinks without added sugar. If you want an alcoholic beverage, choose a lower-calorie option, such as a glass of wine or light beer. Ask for dressings, sauces, and syrups on the side. These are usually high in calories, so you should limit the amount you eat. If you want a salad, choose a garden salad and ask for grilled meats. Avoid extra toppings such as bacon, cheese, or fried items. Ask for the dressing on the side, or ask for olive oil and vinegar or lemon to use as dressing. Estimate how many servings of a food you are given. Knowing serving sizes will help you be aware of how much food you are eating at restaurants. Where to find more information Centers for Disease Control and Prevention: FootballExhibition.com.br U.S. Department of Agriculture: WrestlingReporter.dk Summary Calorie counting means keeping track of how many calories you eat and drink each day. If you eat fewer calories than your body needs, you should lose weight. A healthy amount of weight to lose per week is usually 1-2 lb (0.5-0.9 kg). This usually means reducing your daily calorie intake by 500-750 calories. The number of calories in a food can be found on a  Nutrition Facts label. If a food does not have a Nutrition Facts label, try to look up the calories online or ask your dietitian for help. Use smaller plates, glasses, and bowls for smaller portions and to prevent overeating. Use your calories on foods and drinks that will fill you up and not leave you hungry shortly after a meal. This information  is not intended to replace advice given to you by your health care provider. Make sure you discuss any questions you have with your health care provider. Document Revised: 04/30/2019 Document Reviewed: 04/30/2019 Elsevier Patient Education  2023 [employer].

## 2022-07-23 ENCOUNTER — Encounter: Payer: Self-pay | Admitting: Gastroenterology

## 2022-07-23 ENCOUNTER — Encounter: Payer: Self-pay | Admitting: Family Medicine

## 2022-07-24 NOTE — Progress Notes (Signed)
Abnormal: Stable labs with low HDL Start daily exercise

## 2022-07-24 NOTE — H&P (Signed)
  Candice Arellano is an 47 y.o. G28P2102 female.   Chief Complaint: abnormal uterine bleeding HPI: pt. With IUD in place, and increasing bleeding. Desires endometrial ablation. S/p BTL.  Past Medical History:  Diagnosis Date   Abnormal Pap smear of cervix 2013   colpo was negative per patient   Abnormal uterine bleeding (AUB)    Anemia    Carpal tunnel syndrome on both sides    Chicken pox    as child   CIN II (cervical intraepithelial neoplasia II)    s/p leep 2018   GERD (gastroesophageal reflux disease)    Gestational hypertension    Headache    History of COVID-19 07/2020   03-2022   history of depression    Wears glasses    Wears partial dentures    upper    Past Surgical History:  Procedure Laterality Date   CESAREAN SECTION  2004   LEEP  2018   TENDON REPAIR Left 1992   left thumb   TUBAL LIGATION  2004    Family History  Problem Relation Age of Onset   Heart disease Mother    Hypertension Mother    Arthritis Mother        OA   Heart disease Father    Hypertension Father    Asthma Father    Diabetes Father    Asthma Daughter    Arthritis Maternal Grandmother    Diabetes Maternal Grandmother    Hypertension Maternal Grandmother    Cancer Maternal Grandmother 53       pancreatic   Kidney disease Maternal Grandfather        unknown cause   Heart disease Paternal Grandmother 26       CABG, MI   Arthritis Paternal Grandmother    Heart attack Paternal Grandfather 28       MI cause of death   Stroke Neg Hx    Depression Neg Hx    Alcohol abuse Neg Hx    Drug abuse Neg Hx    Breast cancer Neg Hx    Social History:  reports that she has never smoked. She has never been exposed to tobacco smoke. She has never used smokeless tobacco. She reports current alcohol use of about 1.0 standard drink of alcohol per week. She reports that she does not use drugs.  Allergies:  Allergies  Allergen Reactions   Latex Itching   Shellfish Allergy Swelling    No  medications prior to admission.    A comprehensive review of systems was negative.  Height  (1.6 m), weight 122.5 kg, last menstrual period 07/02/2022. General appearance: alert, cooperative, and appears stated age Head: Normocephalic, without obvious abnormality, atraumatic Neck: supple, symmetrical, trachea midline Lungs:  normal effort Heart: regular rate and rhythm Abdomen: soft, non-tender; bowel sounds normal; no masses,  no organomegaly   No results found for this or any previous visit (from the past 24 hour(s)). No results found.  Assessment/Plan Active Problems:   Anemia   Menorrhagia  For IUD removal, D &  C with Hysteroscopy and HTA Risks include but are not limited to bleeding, infection, injury to surrounding structures, including bowel, bladder and ureters, blood clots, and death.  Likelihood of success is high.    Reva Bores 07/24/2022, 10:20 AM

## 2022-08-04 NOTE — Anesthesia Preprocedure Evaluation (Addendum)
Anesthesia Evaluation  Patient identified by MRN, date of birth, ID band Patient awake    Reviewed: Allergy & Precautions, NPO status , Patient's Chart, lab work & pertinent test results  Airway Mallampati: II  TM Distance: >3 FB Neck ROM: Full    Dental no notable dental hx. (+) Teeth Intact, Dental Advisory Given   Pulmonary    Pulmonary exam normal breath sounds clear to auscultation       Cardiovascular hypertension, Normal cardiovascular exam Rhythm:Regular Rate:Normal     Neuro/Psych  Headaches    GI/Hepatic ,GERD  ,,  Endo/Other    Renal/GU      Musculoskeletal   Abdominal   Peds  Hematology  (+) Blood dyscrasia, anemia Lab Results      Component                Value               Date                      WBC                      6.7                 08/06/2022                HGB                      11.5 (L)            08/06/2022                HCT                      37.5                08/06/2022                MCV                      75.0 (L)            08/06/2022                PLT                      339                 08/06/2022              Anesthesia Other Findings All: Latex  Reproductive/Obstetrics negative OB ROS                             Anesthesia Physical Anesthesia Plan  ASA: 3  Anesthesia Plan: General   Post-op Pain Management: Tylenol PO (pre-op)*, Precedex and Toradol IV (intra-op)*   Induction: Intravenous  PONV Risk Score and Plan: 3 and Treatment may vary due to age or medical condition, Ondansetron, Midazolam and Dexamethasone  Airway Management Planned: LMA  Additional Equipment: None  Intra-op Plan:   Post-operative Plan:   Informed Consent: I have reviewed the patients History and Physical, chart, labs and discussed the procedure including the risks, benefits and alternatives for the proposed anesthesia with the patient or authorized  representative who has indicated his/her understanding and acceptance.     Dental advisory given  Plan Discussed with: Anesthesiologist and CRNA  Anesthesia Plan Comments:         Anesthesia Quick Evaluation

## 2022-08-06 ENCOUNTER — Ambulatory Visit (HOSPITAL_BASED_OUTPATIENT_CLINIC_OR_DEPARTMENT_OTHER)
Admission: RE | Admit: 2022-08-06 | Discharge: 2022-08-06 | Disposition: A | Discharge status: Home / Self Care | Payer: Federal, State, Local not specified - PPO | Attending: Family Medicine | Admitting: Family Medicine

## 2022-08-06 ENCOUNTER — Other Ambulatory Visit: Payer: Self-pay

## 2022-08-06 ENCOUNTER — Ambulatory Visit (HOSPITAL_BASED_OUTPATIENT_CLINIC_OR_DEPARTMENT_OTHER): Payer: Federal, State, Local not specified - PPO | Admitting: Anesthesiology

## 2022-08-06 ENCOUNTER — Encounter (HOSPITAL_BASED_OUTPATIENT_CLINIC_OR_DEPARTMENT_OTHER)
Admission: RE | Disposition: A | Discharge status: Home / Self Care | Payer: Self-pay | Source: Home / Self Care | Attending: Family Medicine

## 2022-08-06 ENCOUNTER — Encounter (HOSPITAL_BASED_OUTPATIENT_CLINIC_OR_DEPARTMENT_OTHER): Payer: Self-pay | Admitting: Family Medicine

## 2022-08-06 DIAGNOSIS — Z30432 Encounter for removal of intrauterine contraceptive device: Secondary | ICD-10-CM

## 2022-08-06 DIAGNOSIS — N858 Other specified noninflammatory disorders of uterus: Secondary | ICD-10-CM | POA: Diagnosis not present

## 2022-08-06 DIAGNOSIS — D649 Anemia, unspecified: Secondary | ICD-10-CM | POA: Diagnosis present

## 2022-08-06 DIAGNOSIS — N920 Excessive and frequent menstruation with regular cycle: Secondary | ICD-10-CM | POA: Diagnosis present

## 2022-08-06 DIAGNOSIS — I10 Essential (primary) hypertension: Secondary | ICD-10-CM | POA: Diagnosis not present

## 2022-08-06 DIAGNOSIS — Z01818 Encounter for other preprocedural examination: Secondary | ICD-10-CM

## 2022-08-06 HISTORY — DX: Presence of dental prosthetic device (complete) (partial): Z97.2

## 2022-08-06 HISTORY — DX: Gastro-esophageal reflux disease without esophagitis: K21.9

## 2022-08-06 HISTORY — DX: Moderate cervical dysplasia: N87.1

## 2022-08-06 HISTORY — DX: Gestational (pregnancy-induced) hypertension without significant proteinuria, unspecified trimester: O13.9

## 2022-08-06 HISTORY — DX: Presence of spectacles and contact lenses: Z97.3

## 2022-08-06 HISTORY — PX: DILITATION & CURRETTAGE/HYSTROSCOPY WITH HYDROTHERMAL ABLATION: SHX5570

## 2022-08-06 HISTORY — DX: Carpal tunnel syndrome, bilateral upper limbs: G56.03

## 2022-08-06 HISTORY — DX: Abnormal uterine and vaginal bleeding, unspecified: N93.9

## 2022-08-06 HISTORY — PX: IUD REMOVAL: SHX5392

## 2022-08-06 LAB — CBC
HCT: 37.5 % (ref 36.0–46.0)
Hemoglobin: 11.5 g/dL — ABNORMAL LOW (ref 12.0–15.0)
MCH: 23 pg — ABNORMAL LOW (ref 26.0–34.0)
MCHC: 30.7 g/dL (ref 30.0–36.0)
MCV: 75 fL — ABNORMAL LOW (ref 80.0–100.0)
Platelets: 339 10*3/uL (ref 150–400)
RBC: 5 MIL/uL (ref 3.87–5.11)
RDW: 14.5 % (ref 11.5–15.5)
WBC: 6.7 10*3/uL (ref 4.0–10.5)
nRBC: 0 % (ref 0.0–0.2)

## 2022-08-06 LAB — POCT PREGNANCY, URINE: Preg Test, Ur: NEGATIVE

## 2022-08-06 SURGERY — DILATATION & CURETTAGE/HYSTEROSCOPY WITH HYDROTHERMAL ABLATION
Anesthesia: General | Site: Uterus

## 2022-08-06 MED ORDER — GABAPENTIN 300 MG PO CAPS
300.0000 mg | ORAL_CAPSULE | ORAL | Status: AC
  Administered 2022-08-06: 300 mg via ORAL

## 2022-08-06 MED ORDER — PROPOFOL 10 MG/ML IV BOLUS
INTRAVENOUS | Status: AC
  Filled 2022-08-06: qty 20

## 2022-08-06 MED ORDER — ONDANSETRON HCL 4 MG/2ML IJ SOLN
INTRAMUSCULAR | Status: AC
  Filled 2022-08-06: qty 10

## 2022-08-06 MED ORDER — ONDANSETRON HCL 4 MG/2ML IJ SOLN: 4.0000 mg | Freq: Once | INTRAMUSCULAR | Status: DC | PRN

## 2022-08-06 MED ORDER — MIDAZOLAM HCL 2 MG/2ML IJ SOLN
INTRAMUSCULAR | Status: AC
  Filled 2022-08-06: qty 2

## 2022-08-06 MED ORDER — ONDANSETRON HCL 4 MG/2ML IJ SOLN
INTRAMUSCULAR | Status: DC | PRN
  Administered 2022-08-06: 4 mg via INTRAVENOUS

## 2022-08-06 MED ORDER — KETOROLAC TROMETHAMINE 30 MG/ML IJ SOLN
INTRAMUSCULAR | Status: DC | PRN
  Administered 2022-08-06: 30 mg via INTRAVENOUS

## 2022-08-06 MED ORDER — HYDROMORPHONE HCL 1 MG/ML IJ SOLN
0.2500 mg | INTRAMUSCULAR | Status: DC | PRN
  Administered 2022-08-06: 0.25 mg via INTRAVENOUS

## 2022-08-06 MED ORDER — LIDOCAINE 2% (20 MG/ML) 5 ML SYRINGE
INTRAMUSCULAR | Status: DC | PRN
  Administered 2022-08-06: 100 mg via INTRAVENOUS

## 2022-08-06 MED ORDER — GABAPENTIN 300 MG PO CAPS
ORAL_CAPSULE | ORAL | Status: AC
  Filled 2022-08-06: qty 1

## 2022-08-06 MED ORDER — LACTATED RINGERS IV SOLN: INTRAVENOUS | Status: DC

## 2022-08-06 MED ORDER — PROPOFOL 10 MG/ML IV BOLUS
INTRAVENOUS | Status: DC | PRN
  Administered 2022-08-06: 200 mg via INTRAVENOUS

## 2022-08-06 MED ORDER — OXYCODONE HCL 5 MG PO TABS
5.0000 mg | ORAL_TABLET | Freq: Once | ORAL | Status: AC | PRN
  Administered 2022-08-06: 5 mg via ORAL

## 2022-08-06 MED ORDER — MIDAZOLAM HCL 2 MG/2ML IJ SOLN
INTRAMUSCULAR | Status: DC | PRN
  Administered 2022-08-06: 2 mg via INTRAVENOUS

## 2022-08-06 MED ORDER — OXYCODONE HCL 5 MG PO TABS: 5.0000 mg | ORAL_TABLET | ORAL | 0 refills | Status: AC | PRN

## 2022-08-06 MED ORDER — ROCURONIUM BROMIDE 10 MG/ML (PF) SYRINGE
PREFILLED_SYRINGE | INTRAVENOUS | Status: AC
  Filled 2022-08-06: qty 20

## 2022-08-06 MED ORDER — ACETAMINOPHEN 500 MG PO TABS
ORAL_TABLET | ORAL | Status: AC
  Filled 2022-08-06: qty 2

## 2022-08-06 MED ORDER — HYDROMORPHONE HCL 1 MG/ML IJ SOLN
INTRAMUSCULAR | Status: AC
  Filled 2022-08-06: qty 1

## 2022-08-06 MED ORDER — ALBUTEROL SULFATE HFA 108 (90 BASE) MCG/ACT IN AERS
INHALATION_SPRAY | RESPIRATORY_TRACT | Status: AC
  Filled 2022-08-06: qty 6.7

## 2022-08-06 MED ORDER — ALBUTEROL SULFATE HFA 108 (90 BASE) MCG/ACT IN AERS
INHALATION_SPRAY | RESPIRATORY_TRACT | Status: DC | PRN
  Administered 2022-08-06: 2 via RESPIRATORY_TRACT

## 2022-08-06 MED ORDER — OXYCODONE HCL 5 MG PO TABS
ORAL_TABLET | ORAL | Status: AC
  Filled 2022-08-06: qty 1

## 2022-08-06 MED ORDER — LIDOCAINE HCL (PF) 2 % IJ SOLN
INTRAMUSCULAR | Status: AC
  Filled 2022-08-06: qty 20

## 2022-08-06 MED ORDER — DEXAMETHASONE SODIUM PHOSPHATE 10 MG/ML IJ SOLN
INTRAMUSCULAR | Status: DC | PRN
  Administered 2022-08-06: 10 mg via INTRAVENOUS

## 2022-08-06 MED ORDER — KETOROLAC TROMETHAMINE 30 MG/ML IJ SOLN: 30.0000 mg | Freq: Once | INTRAMUSCULAR | Status: DC | PRN

## 2022-08-06 MED ORDER — FENTANYL CITRATE (PF) 100 MCG/2ML IJ SOLN
INTRAMUSCULAR | Status: AC
  Filled 2022-08-06: qty 2

## 2022-08-06 MED ORDER — OXYCODONE HCL 5 MG/5ML PO SOLN: 5.0000 mg | Freq: Once | ORAL | Status: AC | PRN

## 2022-08-06 MED ORDER — DEXMEDETOMIDINE HCL IN NACL 80 MCG/20ML IV SOLN
INTRAVENOUS | Status: DC | PRN
  Administered 2022-08-06: 20 ug via INTRAVENOUS

## 2022-08-06 MED ORDER — FENTANYL CITRATE (PF) 250 MCG/5ML IJ SOLN
INTRAMUSCULAR | Status: DC | PRN
  Administered 2022-08-06: 50 ug via INTRAVENOUS
  Administered 2022-08-06: 25 ug via INTRAVENOUS
  Administered 2022-08-06: 50 ug via INTRAVENOUS
  Administered 2022-08-06 (×3): 25 ug via INTRAVENOUS

## 2022-08-06 MED ORDER — SODIUM CHLORIDE 0.9 % IR SOLN
Status: DC | PRN
  Administered 2022-08-06: 3000 mL

## 2022-08-06 MED ORDER — LIDOCAINE HCL 1 % IJ SOLN
INTRAMUSCULAR | Status: DC | PRN
  Administered 2022-08-06: 20 mL

## 2022-08-06 MED ORDER — DEXAMETHASONE SODIUM PHOSPHATE 10 MG/ML IJ SOLN
INTRAMUSCULAR | Status: AC
  Filled 2022-08-06: qty 4

## 2022-08-06 MED ORDER — ACETAMINOPHEN 500 MG PO TABS
1000.0000 mg | ORAL_TABLET | ORAL | Status: AC
  Administered 2022-08-06: 1000 mg via ORAL

## 2022-08-06 MED ORDER — POVIDONE-IODINE 10 % EX SWAB: 2.0000 | Freq: Once | CUTANEOUS | Status: DC

## 2022-08-06 SURGICAL SUPPLY — 17 items
CATH ROBINSON RED A/P 16FR (CATHETERS) ×1 IMPLANT
DILATOR CANAL MILEX (MISCELLANEOUS) IMPLANT
DRSG TELFA 3X8 NADH STRL (GAUZE/BANDAGES/DRESSINGS) ×2 IMPLANT
GAUZE 4X4 16PLY ~~LOC~~+RFID DBL (SPONGE) ×2 IMPLANT
GLOVE BIOGEL PI IND STRL 7.0 (GLOVE) ×2 IMPLANT
GLOVE ECLIPSE 7.0 STRL STRAW (GLOVE) ×2 IMPLANT
GOWN STRL REUS W/TWL XL LVL3 (GOWN DISPOSABLE) ×4 IMPLANT
KIT PROCEDURE FLUENT (KITS) ×1 IMPLANT
KIT TURNOVER CYSTO (KITS) ×2 IMPLANT
LOOP CUTTING BIPOLAR 21FR (ELECTRODE) IMPLANT
PACK VAGINAL MINOR WOMEN LF (CUSTOM PROCEDURE TRAY) ×2 IMPLANT
PAD OB MATERNITY 4.3X12.25 (PERSONAL CARE ITEMS) ×2 IMPLANT
PAD PREP 24X48 CUFFED NSTRL (MISCELLANEOUS) ×2 IMPLANT
SEAL ROD LENS SCOPE MYOSURE (ABLATOR) ×2 IMPLANT
SET GENESYS HTA PROCERVA (MISCELLANEOUS) ×1 IMPLANT
SLEEVE SCD COMPRESS KNEE MED (STOCKING) ×2 IMPLANT
TOWEL OR 17X24 6PK STRL BLUE (TOWEL DISPOSABLE) ×4 IMPLANT

## 2022-08-06 NOTE — Transfer of Care (Signed)
Immediate Anesthesia Transfer of Care Note  Patient: Jeanifer N Simoneau  Procedure(s) Performed: DILATATION & CURETTAGE/HYSTEROSCOPY WITH HYDROTHERMAL ABLATION (Uterus) INTRAUTERINE DEVICE (IUD) REMOVAL (Uterus)  Patient Location: PACU  Anesthesia Type:General  Level of Consciousness: awake, alert , and oriented  Airway & Oxygen Therapy: Patient Spontanous Breathing and Patient connected to face mask oxygen  Post-op Assessment: Report given to RN and Post -op Vital signs reviewed and stable  Post vital signs: Reviewed and stable  Last Vitals:  Vitals Value Taken Time  BP 135/83 08/06/22 1539  Temp 36.5 C 08/06/22 1539  Pulse 93 08/06/22 1542  Resp 16 08/06/22 1542  SpO2 99 % 08/06/22 1542  Vitals shown include unvalidated device data.  Last Pain:  Vitals:   08/06/22 1539  TempSrc:   PainSc: 0-No pain      Patients Stated Pain Goal: 1 (08/06/22 1539)  Complications: No notable events documented.

## 2022-08-06 NOTE — Interval H&P Note (Signed)
History and Physical Interval Note:  08/06/2022 1:29 PM  Candice Arellano  has presented today for surgery, with the diagnosis of AUB.  The various methods of treatment have been discussed with the patient and family. After consideration of risks, benefits and other options for treatment, the patient has consented to  Procedure(s): DILATATION AND CURETTAGE /HYSTEROSCOPY (N/A) as a surgical intervention.  The patient's history has been reviewed, patient examined, no change in status, stable for surgery.  I have reviewed the patient's chart and labs.  Questions were answered to the patient's satisfaction.     Reva Bores

## 2022-08-06 NOTE — Anesthesia Procedure Notes (Signed)
Procedure Name: LMA Insertion Date/Time: 08/06/2022 2:45 PM  Performed by: Dairl Ponder, CRNAPre-anesthesia Checklist: Patient identified, Emergency Drugs available, Suction available and Patient being monitored Patient Re-evaluated:Patient Re-evaluated prior to induction Oxygen Delivery Method: Circle System Utilized Preoxygenation: Pre-oxygenation with 100% oxygen Induction Type: IV induction Ventilation: Mask ventilation without difficulty LMA: LMA inserted LMA Size: 4.0 Number of attempts: 1 Airway Equipment and Method: Bite block Placement Confirmation: positive ETCO2 Tube secured with: Tape Dental Injury: Teeth and Oropharynx as per pre-operative assessment

## 2022-08-06 NOTE — Op Note (Signed)
Preoperative diagnosis: Menorrhagia  Postoperative diagnosis: Menorrhagia  Procedure: HTA endometrial ablation, hysteroscopy, D & C, IUD removal  Surgeon: Shelbie Proctor. Shawnie Pons, M.D.  Anesthesia: GETT - Woodrum, Lorette Ang, MD Paracervical block  Findings: Normal appearing cervix and uterine cavity with both ostia seen.  Estimated blood loss: Minimal  Specimens: Endometrial curettings  Disposition of specimen: Pathology  Reason for procedure: Patient is a 47 y.o. [id number] with abnormal bleeding who desired definitive treatment.   Procedure: Patient was taken to the OR where she was placed in dorsal lithotomy in Allen stirrups. SCDs were in place. An adequate timeout was obtained. The patient was prepped and draped in the usual sterile fashion. A speculum was placed inside the vagina and the cervix visualized. Attempt at IUD removal removed strings only. The cervix was grasped anteriorly with a single-tooth tenaculum. 20 cc of quarter percent Marcaine were injected for paracervical block. The uterus sounded to 10 cm. Sequential dilation was done to a #8 dilator, and the HTA with hysteroscope was introduced into the uterine cavity. The cervix and endometrial lining appeared normal both ostia were seen there was no deformity of the cavity. A seal test was done and was passed. The uterine cavity was heated to between 80 and 90C and HTA was performed for 10 minutes. Cooling was then performed and the instrument removed. IUD noted and removed with uterine dressing forceps. Sharp curettage was then performed and sample sent to pathology. All instrument, needle, and lap counts were correct x2. The patient was awakened taken to recovery room in stable condition.  Reva Bores MD 08/06/2022 3:22 PM

## 2022-08-06 NOTE — Discharge Instructions (Signed)
      Post Anesthesia Home Care Instructions  Activity: Get plenty of rest for the remainder of the day. A responsible individual must stay with you for 24 hours following the procedure.  For the next 24 hours, DO NOT: -Drive a car -Advertising copywriter -Drink alcoholic beverages -Take any medication unless instructed by your physician -Make any legal decisions or sign important papers.  Meals: Start with liquid foods such as gelatin or soup. Progress to regular foods as tolerated. Avoid greasy, spicy, heavy foods. If nausea and/or vomiting occur, drink only clear liquids until the nausea and/or vomiting subsides. Call your physician if vomiting continues.  Special Instructions/Symptoms: Your throat may feel dry or sore from the anesthesia or the breathing tube placed in your throat during surgery. If this causes discomfort, gargle with warm salt water. The discomfort should disappear within 24 hours.   Do not take any tylenol until after 6:30 pm today, if needed.  You received Tylenol 1,000 mg at 12:34 pm today.

## 2022-08-06 NOTE — Progress Notes (Signed)
Patient and her dtr instructed on next dose of oxycodone to be at 9:00 pm today if needed. Understanding verbalized.

## 2022-08-07 NOTE — Anesthesia Postprocedure Evaluation (Signed)
Anesthesia Post Note  Patient: Candice Arellano  Procedure(s) Performed: DILATATION & CURETTAGE/HYSTEROSCOPY WITH HYDROTHERMAL ABLATION (Uterus) INTRAUTERINE DEVICE (IUD) REMOVAL (Uterus)     Patient location during evaluation: PACU Anesthesia Type: General Level of consciousness: awake and alert Pain management: pain level controlled Vital Signs Assessment: post-procedure vital signs reviewed and stable Respiratory status: spontaneous breathing, nonlabored ventilation, respiratory function stable and patient connected to nasal cannula oxygen Cardiovascular status: blood pressure returned to baseline and stable Postop Assessment: no apparent nausea or vomiting Anesthetic complications: no  No notable events documented.  Last Vitals:  Vitals:   08/06/22 1641 08/06/22 1715  BP: (!) 171/95 (!) 159/98  Pulse: 67 62  Resp: 16 16  Temp:  36.6 C  SpO2: 92% 96%    Last Pain:  Vitals:   08/07/22 1108  TempSrc:   PainSc: 3    Pain Goal: Patients Stated Pain Goal: 4 (08/06/22 1715)                 Clearance Chenault L Ronnie Mallette

## 2022-08-08 ENCOUNTER — Encounter (HOSPITAL_BASED_OUTPATIENT_CLINIC_OR_DEPARTMENT_OTHER): Payer: Self-pay | Admitting: Family Medicine

## 2022-08-08 ENCOUNTER — Ambulatory Visit
Admission: EM | Admit: 2022-08-08 | Discharge: 2022-08-08 | Disposition: A | Discharge status: Home / Self Care | Payer: Federal, State, Local not specified - PPO | Attending: Emergency Medicine | Admitting: Emergency Medicine

## 2022-08-08 DIAGNOSIS — T783XXA Angioneurotic edema, initial encounter: Secondary | ICD-10-CM | POA: Diagnosis not present

## 2022-08-08 LAB — SURGICAL PATHOLOGY

## 2022-08-08 MED ORDER — PREDNISONE 10 MG (21) PO TBPK: ORAL_TABLET | Freq: Every day | ORAL | 0 refills | Status: DC

## 2022-08-08 MED ORDER — DEXAMETHASONE SODIUM PHOSPHATE 10 MG/ML IJ SOLN
10.0000 mg | Freq: Once | INTRAMUSCULAR | Status: AC
  Administered 2022-08-08: 10 mg via INTRAMUSCULAR

## 2022-08-08 NOTE — ED Triage Notes (Signed)
Patient to Urgent Care with complaints of possible allergic reaction. Reports that her lips started to swell this afternoon.   Surgery on Monday. Has taken one dose of oxycodone on Monday but has been treating pain with advil. No other recent product changes. Only known allergies are to latex/ shellfish.

## 2022-08-08 NOTE — Discharge Instructions (Addendum)
You were given an injection of a steroid called dexamethaxone.  Start the prednisone taper tomorrow as directed.    Take Benadryl or Zyrtec as directed.    Follow up with your primary care provider tomorrow.   Call 911 and Go to the emergency department if you have difficulty swallowing or breathing.

## 2022-08-08 NOTE — Telephone Encounter (Signed)
TC to pt regarding lip swelling pt advised to go local urgent care for evaluation Pt given address and contact info to [facility] made aware current wait time was only 6 mins Advised may try Benadryl yet would need a driver on getting back home.

## 2022-08-08 NOTE — ED Provider Notes (Signed)
[facility]    CSN: [id number] Arrival date & time: 08/08/22  1619      History   Chief Complaint Chief Complaint  Patient presents with   Allergic Reaction    I had surgery Monday and my [facility] Dr Tinnie Gens advised I should come in to get checked out bc my face and lips are swelling  and I may be having an allergic reaction - Entered by patient    HPI Candice Arellano is a 47 y.o. female.  Patient presents with swelling of her lips and face since this afternoon.  She had anesthesia and one dose of oxycodone on 08/06/2022.  No other new medications.  No new foods or products.  No difficulty swallowing or breathing.  No rash, redness, sore throat, cough, or other symptoms.  No treatment at home.  She had dilatation & curettage/hysteroscopy with hydrothermal ablation on 08/06/2022.   The history is provided by the patient and medical records.    Past Medical History:  Diagnosis Date   Abnormal Pap smear of cervix 2013   colpo was negative per patient   Abnormal uterine bleeding (AUB)    Anemia    Carpal tunnel syndrome on both sides    Chicken pox    as child   CIN II (cervical intraepithelial neoplasia II)    s/p leep 2018   GERD (gastroesophageal reflux disease)    Gestational hypertension    Headache    History of COVID-19 07/2020   03-2022   history of depression    Wears glasses    Wears partial dentures    upper    Patient Active Problem List   Diagnosis Date Noted   Sciatica 06/03/2014   Cervical intraepithelial neoplasia grade 3 11/07/2013   Anemia 10/18/2010   Obesity 10/18/2010   Menorrhagia 10/18/2010    Past Surgical History:  Procedure Laterality Date   CESAREAN SECTION  2004   DILITATION & CURRETTAGE/HYSTROSCOPY WITH HYDROTHERMAL ABLATION  08/06/2022   Procedure: DILATATION & CURETTAGE/HYSTEROSCOPY WITH HYDROTHERMAL ABLATION;  Surgeon: Reva Bores, MD;  Location: [facility];  Service:  Gynecology;;   IUD REMOVAL  08/06/2022   Procedure: INTRAUTERINE DEVICE (IUD) REMOVAL;  Surgeon: Reva Bores, MD;  Location: [facility];  Service: Gynecology;;   LEEP  2018   TENDON REPAIR Left 1992   left thumb   TUBAL LIGATION  2004    OB History     Gravida  3   Para  3   Term  2   Preterm  1   AB      Living  2      SAB      IAB      Ectopic      Multiple      Live Births               Home Medications    Prior to Admission medications   Medication Sig Start Date End Date Taking? Authorizing Provider  predniSONE (STERAPRED UNI-PAK 21 TAB) 10 MG (21) TBPK tablet Take by mouth daily. As directed 08/09/22  Yes Mickie Bail, NP  ASHWAGANDHA PO Take by mouth as needed. gummy    [provider]  Azelastine-Fluticasone (DYMISTA) 137-50 MCG/ACT SUSP Place 1 spray into the nose in the morning and at bedtime. Patient not taking: Reported on 08/25/2020 04/20/20   Marcelyn Bruins, MD  fluconazole (DIFLUCAN) 150 MG  tablet Take by mouth as needed. 08/20/21   [provider]  ibuprofen (ADVIL) 400 MG tablet Take 400 mg by mouth every 6 (six) hours as needed. Takes 2 of 200 mg    [provider]  Lactobacillus (PROBIOTIC ACIDOPHILUS PO) Take 1 tablet by mouth daily.    [provider]  Multiple Vitamin (MULTIVITAMIN) tablet Take 1 tablet by mouth daily.    [provider]  norethindrone (AYGESTIN) 5 MG tablet Take 1 tablet (5 mg total) by mouth daily. Patient taking differently: Take 5 mg by mouth at bedtime. 06/14/22   Reva Bores, MD  nystatin cream (MYCOSTATIN) Apply 1 Application topically 2 (two) times daily. Patient taking differently: Apply 1 Application topically as needed. 01/03/22   Reva Bores, MD  OVER THE COUNTER MEDICATION Elderberry gummy with vit d and vit c 2 per day    [provider]  oxyCODONE (ROXICODONE) 5 MG immediate release tablet Take 1 tablet (5 mg total) by mouth  every 4 (four) hours as needed for severe pain. 08/06/22   Reva Bores, MD    Family History Family History  Problem Relation Age of Onset   Heart disease Mother    Hypertension Mother    Arthritis Mother        OA   Heart disease Father    Hypertension Father    Asthma Father    Diabetes Father    Asthma Daughter    Arthritis Maternal Grandmother    Diabetes Maternal Grandmother    Hypertension Maternal Grandmother    Cancer Maternal Grandmother 53       pancreatic   Kidney disease Maternal Grandfather        unknown cause   Heart disease Paternal Grandmother 71       CABG, MI   Arthritis Paternal Grandmother    Heart attack Paternal Grandfather 57       MI cause of death   Stroke Neg Hx    Depression Neg Hx    Alcohol abuse Neg Hx    Drug abuse Neg Hx    Breast cancer Neg Hx     Social History Social History   Tobacco Use   Smoking status: Never    Passive exposure: Never   Smokeless tobacco: Never  Vaping Use   Vaping Use: Never used  Substance Use Topics   Alcohol use: Yes    Alcohol/week: 1.0 standard drink of alcohol    Types: 1 Glasses of wine per week    Comment: occasionally   Drug use: No     Allergies   Latex and Shellfish allergy   Review of Systems Review of Systems  Constitutional:  Negative for chills and fever.  HENT:  Positive for facial swelling. Negative for sore throat, trouble swallowing and voice change.   Respiratory:  Negative for cough and shortness of breath.   Cardiovascular:  Negative for chest pain and palpitations.  Skin:  Negative for color change and rash.  Neurological:  Negative for weakness and numbness.     Physical Exam Triage Vital Signs ED Triage Vitals [08/08/22 1628]  Enc Vitals Group     BP      Pulse Rate 79     Resp 18     Temp 98.3 F (36.8 C)     Temp src      SpO2 95 %     Weight      Height      Head  Circumference      Peak Flow      Pain Score      Pain Loc      Pain Edu?      Excl.  in GC?    No data found.  Updated Vital Signs BP (!) 137/90   Pulse 79   Temp 98.3 F (36.8 C)   Resp 18   LMP 07/02/2022 Comment: ablation  SpO2 95%   Visual Acuity Right Eye Distance:   Left Eye Distance:   Bilateral Distance:    Right Eye Near:   Left Eye Near:    Bilateral Near:     Physical Exam Vitals and nursing note reviewed.  Constitutional:      General: She is not in acute distress.    Appearance: She is well-developed. She is obese. She is not ill-appearing.  HENT:     Mouth/Throat:     Mouth: Mucous membranes are moist.     Pharynx: Oropharynx is clear.     Comments: Mild generalized facial edema.  No rash.  No oropharyngeal swelling.  Voice clear.  No difficulty swallowing. Cardiovascular:     Rate and Rhythm: Normal rate and regular rhythm.     Heart sounds: Normal heart sounds.  Pulmonary:     Effort: Pulmonary effort is normal. No respiratory distress.     Breath sounds: Normal breath sounds. No stridor. No wheezing, rhonchi or rales.     Comments: Good air movement.  Lungs are clear. Musculoskeletal:     Cervical back: Neck supple.  Skin:    General: Skin is warm and dry.     Findings: No rash.  Neurological:     Mental Status: She is alert.  Psychiatric:        Mood and Affect: Mood normal.        Behavior: Behavior normal.      UC Treatments / Results  Labs (all labs ordered are listed, but only abnormal results are displayed) Labs Reviewed - No data to display  EKG   Radiology No results found.  Procedures Procedures (including critical care time)  Medications Ordered in UC Medications  dexamethasone (DECADRON) injection 10 mg (10 mg Intramuscular Given 08/08/22 1654)    Initial Impression / Assessment and Plan / UC Course  I have reviewed the triage vital signs and the nursing notes.  Pertinent labs & imaging results that were available during my care of the patient were reviewed by me and considered in my medical decision  making (see chart for details).   Angioedema.  No respiratory distress. VSS.  Dexamethasone given here and starting prednisone tomorrow.  No antihistamine taken yet as patient is driving.  She will take 50 mg of Benadryl when she gets home.  Discussed antihistamine use, Zyrtec or Benadryl.  911 and ED precautions given.  Education provided on angioedema.  Instructed patient to follow-up with her PCP tomorrow.  She agrees to plan of care.   Final Clinical Impressions(s) / UC Diagnoses   Final diagnoses:  Angioedema, initial encounter     Discharge Instructions      You were given an injection of a steroid called dexamethaxone.  Start the prednisone taper tomorrow as directed.    Take Benadryl or Zyrtec as directed.    Follow up with your primary care provider tomorrow.   Call 911 and Go to the emergency department if you have difficulty swallowing or breathing.         ED Prescriptions  Medication Sig Dispense Auth. Provider   predniSONE (STERAPRED UNI-PAK 21 TAB) 10 MG (21) TBPK tablet Take by mouth daily. As directed 21 tablet Mickie Bail, NP      PDMP not reviewed this encounter.   Mickie Bail, NP 08/08/22 [phone]

## 2022-08-22 ENCOUNTER — Telehealth (INDEPENDENT_AMBULATORY_CARE_PROVIDER_SITE_OTHER): Payer: Federal, State, Local not specified - PPO | Admitting: Family Medicine

## 2022-08-22 DIAGNOSIS — Z09 Encounter for follow-up examination after completed treatment for conditions other than malignant neoplasm: Secondary | ICD-10-CM | POA: Diagnosis not present

## 2022-08-22 NOTE — Progress Notes (Signed)
Virtual GYN Post Op following  HTA endometrial ablation, hysteroscopy, D & C, IUD removal.  Pt reports she is feeling much better.   Had recent Urgent care visit for swelling in face and lips possible allergic reaction pt has since recovered.  CC: None

## 2022-08-22 NOTE — Progress Notes (Signed)
GYNECOLOGY VIRTUAL VISIT ENCOUNTER NOTE  Provider location: [facility]   Patient location: Home  I connected with Candice Arellano on 08/22/22 at  8:35 AM EDT by MyChart Video Encounter and verified that I am speaking with the correct person using two identifiers.   I discussed the limitations, risks, security and privacy concerns of performing an evaluation and management service virtually and the availability of in person appointments. I also discussed with the patient that there may be a patient responsible charge related to this service. The patient expressed understanding and agreed to proceed.   History:  Candice Arellano is a 47 y.o. [id number] female being evaluated today for postop check from D and C with HTA on 08/06/22. She reports continued vaginal discharge. Did have allergic reaction to some medication, though she is unclear which med it was. She is having abnormal vaginal discharge c/w HTA. She denies any bleeding, pelvic pain or other concerns.       Past Medical History:  Diagnosis Date   Abnormal Pap smear of cervix 2013   colpo was negative per patient   Abnormal uterine bleeding (AUB)    Anemia    Carpal tunnel syndrome on both sides    Chicken pox    as child   CIN II (cervical intraepithelial neoplasia II)    s/p leep 2018   GERD (gastroesophageal reflux disease)    Gestational hypertension    Headache    History of COVID-19 07/2020   03-2022   history of depression    Wears glasses    Wears partial dentures    upper   Past Surgical History:  Procedure Laterality Date   CESAREAN SECTION  2004   DILITATION & CURRETTAGE/HYSTROSCOPY WITH HYDROTHERMAL ABLATION  08/06/2022   Procedure: DILATATION & CURETTAGE/HYSTEROSCOPY WITH HYDROTHERMAL ABLATION;  Surgeon: Reva Bores, MD;  Location: [facility];  Service: Gynecology;;   IUD REMOVAL  08/06/2022   Procedure: INTRAUTERINE DEVICE (IUD) REMOVAL;  Surgeon:  Reva Bores, MD;  Location: [facility];  Service: Gynecology;;   LEEP  2018   TENDON REPAIR Left 1992   left thumb   TUBAL LIGATION  2004   The following portions of the patient's history were reviewed and updated as appropriate: allergies, current medications, past family history, past medical history, past social history, past surgical history and problem list.   Health Maintenance:  Normal pap and negative HRHPV on 01/03/2022.  Normal mammogram on 11/03/2021.   Review of Systems:  Pertinent items noted in HPI and remainder of comprehensive ROS otherwise negative.  Physical Exam:   General:  Alert, oriented and cooperative. Patient appears to be in no acute distress.  Mental Status: Normal mood and affect. Normal behavior. Normal judgment and thought content.   Respiratory: Normal respiratory effort, no problems with respiration noted  Rest of physical exam deferred due to type of encounter  Labs and Imaging No results found for this or any previous visit (from the past 336 hour(s)). No results found.     Assessment and Plan:     Postop check - doing well. Usual time frame for discharge noted. S/p IUD removal.       I discussed the assessment and treatment plan with the patient. The patient was provided an opportunity to ask questions and all were answered. The patient agreed with the plan and demonstrated an understanding of the instructions.   The patient was advised  to call back or seek an in-person evaluation/go to the ED if the symptoms worsen or if the condition fails to improve as anticipated.  I provided 4 minutes of face-to-face time during this encounter.   Reva Bores, MD [facility] [employer]

## 2022-09-21 ENCOUNTER — Encounter: Payer: Federal, State, Local not specified - PPO | Admitting: Gastroenterology

## 2022-11-09 ENCOUNTER — Ambulatory Visit (AMBULATORY_SURGERY_CENTER): Payer: Federal, State, Local not specified - PPO | Admitting: *Deleted

## 2022-11-09 VITALS — Ht 63.0 in | Wt 250.0 lb

## 2022-11-09 DIAGNOSIS — Z1211 Encounter for screening for malignant neoplasm of colon: Secondary | ICD-10-CM

## 2022-11-09 MED ORDER — PEG 3350-KCL-NA BICARB-NACL 420 G PO SOLR
4000.0000 mL | Freq: Once | ORAL | 0 refills | Status: AC
Start: 2022-11-09 — End: 2022-11-09

## 2022-11-09 NOTE — Progress Notes (Signed)
Pt's name and DOB verified at the beginning of the pre-visit.  Pt denies any difficulty with ambulating,sitting, laying down or rolling side to side Gave both [facility] main # and MD on call # prior to instructions.  No egg or soy allergy known to patient  Pt had issue with procedure with swelling and hive after pt to request information r/t this issue to find out what med/meds she reacted. She is to bring information to day off procedure and inform staff. Pt has no issues moving head neck or swallowing No FH of Malignant Hyperthermia Pt is not on diet pills Pt is not on home 02  Pt is not on blood thinners  Pt denies issues with constipation  Pt is not on dialysis Pt denise any abnormal heart rhythms  Pt denies any upcoming cardiac testing Pt encouraged to use to use Singlecare or Goodrx to reduce cost  Patient's chart reviewed by Candice Arellano CNRA prior to pre-visit and patient appropriate for the [facility].  Pre-visit completed and red dot placed by patient's name on their procedure day (on provider's schedule).  . Visit by phone Pt states weight is 250 lb Instructed pt why it is important to and  to call if they have any changes in health or new medications. Directed them to the # given and on instructions.   Pt states they will.  Instructions reviewed with pt and pt states understanding. Instructed to review again prior to procedure. Pt states they will.  Instructions sent by mail with coupon and by my chart

## 2022-11-19 ENCOUNTER — Encounter: Payer: Self-pay | Admitting: Family Medicine

## 2022-12-03 ENCOUNTER — Encounter: Payer: Self-pay | Admitting: Certified Registered Nurse Anesthetist

## 2022-12-06 ENCOUNTER — Encounter: Payer: Self-pay | Admitting: Gastroenterology

## 2022-12-06 ENCOUNTER — Ambulatory Visit (AMBULATORY_SURGERY_CENTER): Payer: Federal, State, Local not specified - PPO | Admitting: Gastroenterology

## 2022-12-06 VITALS — BP 144/69 | HR 75 | Temp 98.4°F | Resp 12 | Ht 63.0 in | Wt 250.0 lb

## 2022-12-06 DIAGNOSIS — Z1211 Encounter for screening for malignant neoplasm of colon: Secondary | ICD-10-CM | POA: Diagnosis not present

## 2022-12-06 MED ORDER — SODIUM CHLORIDE 0.9 % IV SOLN
500.0000 mL | INTRAVENOUS | Status: DC
Start: 2022-12-06 — End: 2022-12-06

## 2022-12-06 NOTE — Patient Instructions (Signed)
 Repeat colonoscopy in 10 years for screening  YOU HAD AN ENDOSCOPIC PROCEDURE TODAY AT THE [facility]:   Refer to the procedure report that was given to you for any specific questions about what was found during the examination.  If the procedure report does not answer your questions, please call your gastroenterologist to clarify.  If you requested that your care partner not be given the details of your procedure findings, then the procedure report has been included in a sealed envelope for you to review at your convenience later.  YOU SHOULD EXPECT: Some feelings of bloating in the abdomen. Passage of more gas than usual.  Walking can help get rid of the air that was put into your GI tract during the procedure and reduce the bloating. If you had a lower endoscopy (such as a colonoscopy or flexible sigmoidoscopy) you may notice spotting of blood in your stool or on the toilet paper. If you underwent a bowel prep for your procedure, you may not have a normal bowel movement for a few days.  Please Note:  You might notice some irritation and congestion in your nose or some drainage.  This is from the oxygen used during your procedure.  There is no need for concern and it should clear up in a day or so.  SYMPTOMS TO REPORT IMMEDIATELY:  Following lower endoscopy (colonoscopy or flexible sigmoidoscopy):  Excessive amounts of blood in the stool  Significant tenderness or worsening of abdominal pains  Swelling of the abdomen that is new, acute  Fever of 100F or higher   For urgent or emergent issues, a gastroenterologist can be reached at any hour by calling [phone]. Do not use MyChart messaging for urgent concerns.    DIET:  We do recommend a small meal at first, but then you may proceed to your regular diet.  Drink plenty of fluids but you should avoid alcoholic beverages for 24 hours.  ACTIVITY:  You should plan to take it easy for the rest of today and you should NOT DRIVE  or use heavy machinery until tomorrow (because of the sedation medicines used during the test).    FOLLOW UP: Our staff will call the number listed on your records the next business day following your procedure.  We will call around 7:15- 8:00 am to check on you and address any questions or concerns that you may have regarding the information given to you following your procedure. If we do not reach you, we will leave a message.     If any biopsies were taken you will be contacted by phone or by letter within the next 1-3 weeks.  Please call us at [phone] if you have not heard about the biopsies in 3 weeks.    SIGNATURES/CONFIDENTIALITY: You and/or your care partner have signed paperwork which will be entered into your electronic medical record.  These signatures attest to the fact that that the information above on your After Visit Summary has been reviewed and is understood.  Full responsibility of the confidentiality of this discharge information lies with you and/or your care-partner.

## 2022-12-06 NOTE — Op Note (Signed)
[facility] Patient Name: Candice Arellano Procedure Date: 12/06/2022 1:24 PM MRN: [id number] Endoscopist: Meryl Dare , MD, [phone] Age: 47 Referring MD:  Date of Birth: 09-May-1975 Gender: Female Account #: [id number] Procedure:                Colonoscopy Indications:              Screening for colorectal malignant neoplasm Medicines:                Monitored Anesthesia Care Procedure:                Pre-Anesthesia Assessment:                           - Prior to the procedure, a History and Physical                            was performed, and patient medications and                            allergies were reviewed. The patient's tolerance of                            previous anesthesia was also reviewed. The risks                            and benefits of the procedure and the sedation                            options and risks were discussed with the patient.                            All questions were answered, and informed consent                            was obtained. Prior Anticoagulants: The patient has                            taken no anticoagulant or antiplatelet agents. ASA                            Grade Assessment: III - A patient with severe                            systemic disease. After reviewing the risks and                            benefits, the patient was deemed in satisfactory                            condition to undergo the procedure.                           After obtaining informed consent, the colonoscope  was passed under direct vision. Throughout the                            procedure, the patient's blood pressure, pulse, and                            oxygen saturations were monitored continuously. The                            Olympus CF-HQ190L ([id number]) Colonoscope was                            introduced through the anus and advanced to the the                            cecum,  identified by appendiceal orifice and                            ileocecal valve. The ileocecal valve, appendiceal                            orifice, and rectum were photographed. The quality                            of the bowel preparation was excellent. The                            colonoscopy was performed without difficulty. The                            patient tolerated the procedure well. Scope In: 1:28:41 PM Scope Out: 1:38:32 PM Scope Withdrawal Time: 0 hours 7 minutes 17 seconds  Total Procedure Duration: 0 hours 9 minutes 51 seconds  Findings:                 The perianal and digital rectal examinations were                            normal.                           The entire examined colon appeared normal on direct                            and retroflexion views. Complications:            No immediate complications. Estimated blood loss:                            None. Estimated Blood Loss:     Estimated blood loss: none. Impression:               - The entire examined colon is normal on direct and                            retroflexion views.                           -  No specimens collected. Recommendation:           - Repeat colonoscopy in 10 years for screening                            purposes.                           - Patient has a contact number available for                            emergencies. The signs and symptoms of potential                            delayed complications were discussed with the                            patient. Return to normal activities tomorrow.                            Written discharge instructions were provided to the                            patient.                           - Resume previous diet.                           - Continue present medications. Meryl Dare, MD 12/06/2022 1:40:58 PM This report has been signed electronically.

## 2022-12-06 NOTE — Progress Notes (Signed)
Report given to PACU, vss 

## 2022-12-06 NOTE — Progress Notes (Signed)
Pt's states no medical or surgical changes since previsit or office visit. 

## 2022-12-06 NOTE — Progress Notes (Signed)
History & Physical  Primary Care Physician:  Nche, Bonna Gains, NP Primary Gastroenterologist: Claudette Head, MD  Impression / Plan:  Average risk versus screening for colonoscopy.  CHIEF COMPLAINT:  CRC screening  HPI: Candice Arellano is a 47 y.o. female average risk CRC screening for colonoscopy.    Past Medical History:  Diagnosis Date   Abnormal Pap smear of cervix 2013   colpo was negative per patient   Abnormal uterine bleeding (AUB)    Allergy    Pt stated had reaction to med after procedure in 08/2022. Pt to find out what med. Sweeling and hives   Anemia    Carpal tunnel syndrome on both sides    Chicken pox    as child   CIN II (cervical intraepithelial neoplasia II)    s/p leep 2018   GERD (gastroesophageal reflux disease)    Gestational hypertension    Headache    History of COVID-19 07/2020   03-2022   history of depression    Wears glasses    Wears partial dentures    upper    Past Surgical History:  Procedure Laterality Date   CESAREAN SECTION  2004   DILITATION & CURRETTAGE/HYSTROSCOPY WITH HYDROTHERMAL ABLATION  08/06/2022   Procedure: DILATATION & CURETTAGE/HYSTEROSCOPY WITH HYDROTHERMAL ABLATION;  Surgeon: Reva Bores, MD;  Location: [facility];  Service: Gynecology;;   IUD REMOVAL  08/06/2022   Procedure: INTRAUTERINE DEVICE (IUD) REMOVAL;  Surgeon: Reva Bores, MD;  Location: [facility];  Service: Gynecology;;   LEEP  2018   TENDON REPAIR Left 1992   left thumb   TUBAL LIGATION  2004   VAGINAL DELIVERY     1999    Prior to Admission medications   Medication Sig Start Date End Date Taking? Authorizing Provider  Lactobacillus (PROBIOTIC ACIDOPHILUS PO) Take 1 tablet by mouth daily.   Yes [provider]  Multiple Vitamin (MULTIVITAMIN) tablet Take 1 tablet by mouth daily.   Yes [provider]  OVER THE COUNTER MEDICATION Elderberry gummy with vit d and vit c 2 per day   Yes  [provider]  ASHWAGANDHA PO Take by mouth as needed. gummy    [provider]  Azelastine-Fluticasone (DYMISTA) 137-50 MCG/ACT SUSP Place 1 spray into the nose in the morning and at bedtime. Patient not taking: Reported on 08/25/2020 04/20/20   Marcelyn Bruins, MD  fluconazole (DIFLUCAN) 150 MG tablet Take by mouth as needed. 08/20/21   [provider]  ibuprofen (ADVIL) 400 MG tablet Take 400 mg by mouth every 6 (six) hours as needed. Takes 2 of 200 mg Patient not taking: Reported on 11/09/2022    [provider]  norethindrone (AYGESTIN) 5 MG tablet Take 1 tablet (5 mg total) by mouth daily. Patient not taking: Reported on 11/09/2022 06/14/22   Reva Bores, MD  nystatin cream (MYCOSTATIN) Apply 1 Application topically 2 (two) times daily. Patient taking differently: Apply 1 Application topically as needed. 01/03/22   Reva Bores, MD  oxyCODONE (ROXICODONE) 5 MG immediate release tablet Take 1 tablet (5 mg total) by mouth every 4 (four) hours as needed for severe pain. Patient not taking: Reported on 11/09/2022 08/06/22   Reva Bores, MD  predniSONE (STERAPRED UNI-PAK 21 TAB) 10 MG (21) TBPK tablet Take by mouth daily. As directed Patient not taking: Reported on 11/09/2022 08/09/22   Mickie Bail, NP    Current Outpatient Medications  Medication  Sig Dispense Refill   Lactobacillus (PROBIOTIC ACIDOPHILUS PO) Take 1 tablet by mouth daily.     Multiple Vitamin (MULTIVITAMIN) tablet Take 1 tablet by mouth daily.     OVER THE COUNTER MEDICATION Elderberry gummy with vit d and vit c 2 per day     ASHWAGANDHA PO Take by mouth as needed. gummy     Azelastine-Fluticasone (DYMISTA) 137-50 MCG/ACT SUSP Place 1 spray into the nose in the morning and at bedtime. (Patient not taking: Reported on 08/25/2020) 23 g 5   fluconazole (DIFLUCAN) 150 MG tablet Take by mouth as needed.     ibuprofen (ADVIL) 400 MG tablet Take 400 mg by mouth every 6 (six) hours as needed.  Takes 2 of 200 mg (Patient not taking: Reported on 11/09/2022)     norethindrone (AYGESTIN) 5 MG tablet Take 1 tablet (5 mg total) by mouth daily. (Patient not taking: Reported on 11/09/2022) 60 tablet 1   nystatin cream (MYCOSTATIN) Apply 1 Application topically 2 (two) times daily. (Patient taking differently: Apply 1 Application topically as needed.) 30 g 2   oxyCODONE (ROXICODONE) 5 MG immediate release tablet Take 1 tablet (5 mg total) by mouth every 4 (four) hours as needed for severe pain. (Patient not taking: Reported on 11/09/2022) 5 tablet 0   predniSONE (STERAPRED UNI-PAK 21 TAB) 10 MG (21) TBPK tablet Take by mouth daily. As directed (Patient not taking: Reported on 11/09/2022) 21 tablet 0   Current Facility-Administered Medications  Medication Dose Route Frequency Provider Last Rate Last Admin   0.9 %  sodium chloride infusion  500 mL Intravenous Continuous Meryl Dare, MD        Allergies as of 12/06/2022 - Review Complete 12/06/2022  Allergen Reaction Noted   Latex Itching 05/19/2018   Shellfish allergy Swelling 07/20/2022    Family History  Problem Relation Age of Onset   Heart disease Mother    Hypertension Mother    Arthritis Mother        OA   Heart disease Father    Hypertension Father    Asthma Father    Diabetes Father    Stomach cancer Maternal Aunt    Arthritis Maternal Grandmother    Diabetes Maternal Grandmother    Hypertension Maternal Grandmother    Cancer Maternal Grandmother 36       pancreatic   Kidney disease Maternal Grandfather        unknown cause   Heart disease Paternal Grandmother 68       CABG, MI   Arthritis Paternal Grandmother    Heart attack Paternal Grandfather 76       MI cause of death   Asthma Daughter    Stroke Neg Hx    Depression Neg Hx    Alcohol abuse Neg Hx    Drug abuse Neg Hx    Breast cancer Neg Hx    Colon cancer Neg Hx    Colon polyps Neg Hx    Esophageal cancer Neg Hx    Rectal cancer Neg Hx     Social History    Socioeconomic History   Marital status: Divorced    Spouse name: Not on file   Number of children: 2   Years of education: Not on file   Highest education level: Not on file  Occupational History   Occupation: unemployed  Tobacco Use   Smoking status: Never    Passive exposure: Never   Smokeless tobacco: Never  Vaping Use   Vaping status:  Never Used  Substance and Sexual Activity   Alcohol use: Yes    Alcohol/week: 1.0 standard drink of alcohol    Types: 1 Glasses of wine per week    Comment: occasionally   Drug use: No   Sexual activity: Yes    Partners: Male    Birth control/protection: Surgical, I.U.D.    Comment: tubal ligation, IUD  Other Topics Concern   Not on file  Social History Narrative   Lives with parents and daughter and son. In a relationship for past year and a half.  Divorced.  Unemployed.  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   Social Determinants of Health   Financial Resource Strain: Not on file  Food Insecurity: Not on file  Transportation Needs: Not on file  Physical Activity: Not on file  Stress: Not on file  Social Connections: Not on file  Intimate Partner Violence: Not on file    Review of Systems:  All systems reviewed were negative except where noted in HPI.   Physical Exam:  General:  Alert, well-developed, in NAD Head:  Normocephalic and atraumatic. Eyes:  Sclera clear, no icterus.   Conjunctiva pink. Ears:  Normal auditory acuity. Mouth:  No deformity or lesions.  Neck:  Supple; no masses. Lungs:  Clear throughout to auscultation.   No wheezes, crackles, or rhonchi.  Heart:  Regular rate and rhythm; no murmurs. Abdomen:  Soft, nondistended, nontender. No masses, hepatomegaly. No palpable masses.  Normal bowel sounds.    Rectal:  Deferred   Msk:  Symmetrical without gross deformities. Extremities:  Without edema. Neurologic:   Alert and  oriented x 4; grossly normal neurologically. Skin:  Intact without significant lesions or rashes. Psych:  Alert and cooperative. Normal mood and affect.   Venita Lick. Russella Dar  12/06/2022, 1:17 PM See Loretha Stapler, [facility], to contact our on call provider

## 2022-12-07 ENCOUNTER — Telehealth: Payer: Self-pay

## 2022-12-07 NOTE — Telephone Encounter (Signed)
  Follow up Call-     12/06/2022   12:58 PM  Call back number  Post procedure Call Back phone  # [phone]  Permission to leave phone message Yes     Patient questions:  Do you have a fever, pain , or abdominal swelling? No. Pain Score  0 *  Have you tolerated food without any problems? Yes.    Have you been able to return to your normal activities? Yes.    Do you have any questions about your discharge instructions: Diet   No. Medications  No. Follow up visit  No.  Do you have questions or concerns about your Care? No.  Actions: * If pain score is 4 or above: No action needed, pain <4.

## 2023-05-05 IMAGING — MG DIGITAL DIAGNOSTIC BILAT W/ TOMO W/ CAD
6 of 12 series · 6 of 36 positions shown · non-contrast
Comparison: Previous exam(s).

CLINICAL DATA: 45-year-old female with an increasingly painful lump
in the right axilla for 1 month, worsening recently.

EXAM:
DIGITAL DIAGNOSTIC BILATERAL MAMMOGRAM WITH TOMOSYNTHESIS AND CAD;
US AXILLARY RIGHT
TECHNIQUE: Bilateral digital diagnostic mammography and breast tomosynthesis
was performed. The images were evaluated with computer-aided
detection.; Targeted ultrasound examination of the right axilla was
performed.

[R MLO synth-2D (1 of 2)]
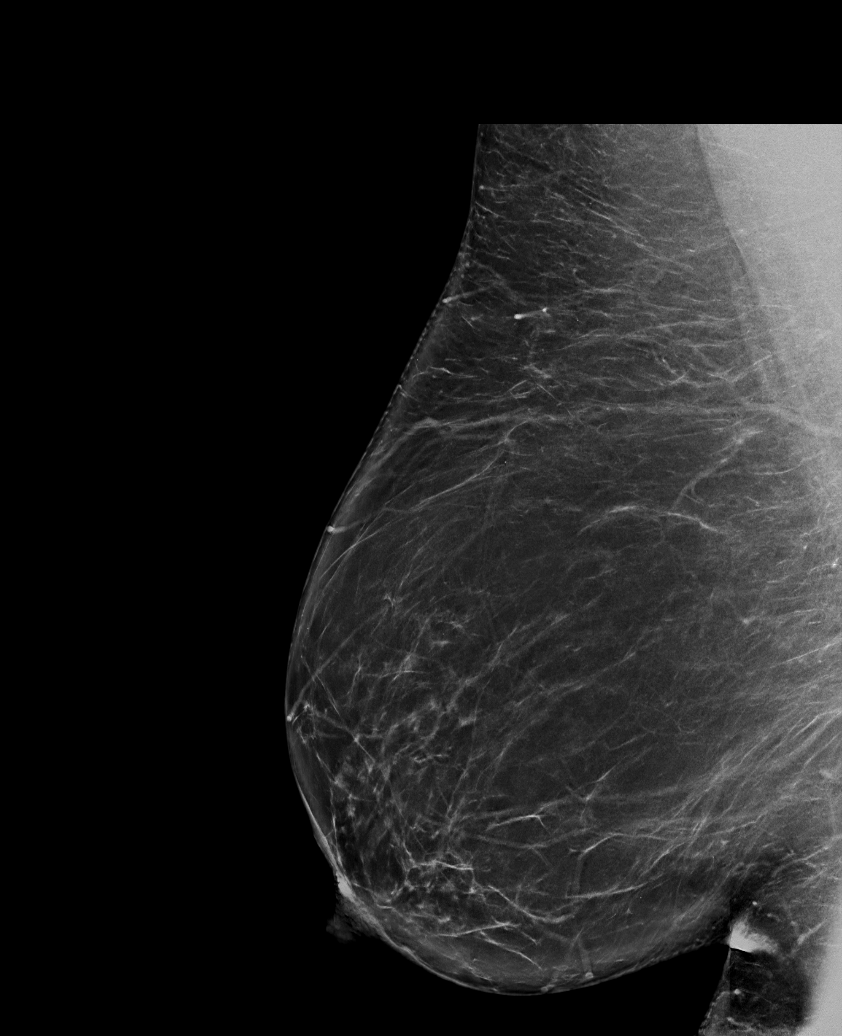

[R CC synth-2D]
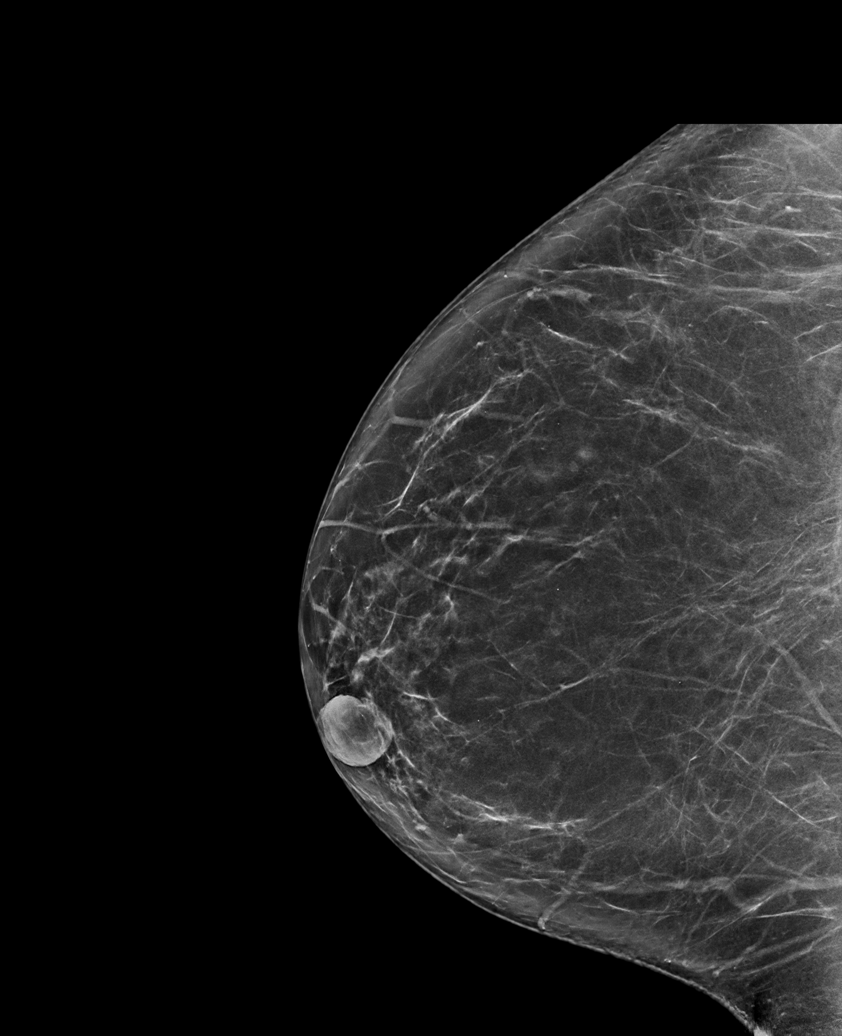

[L CV synth-2D]
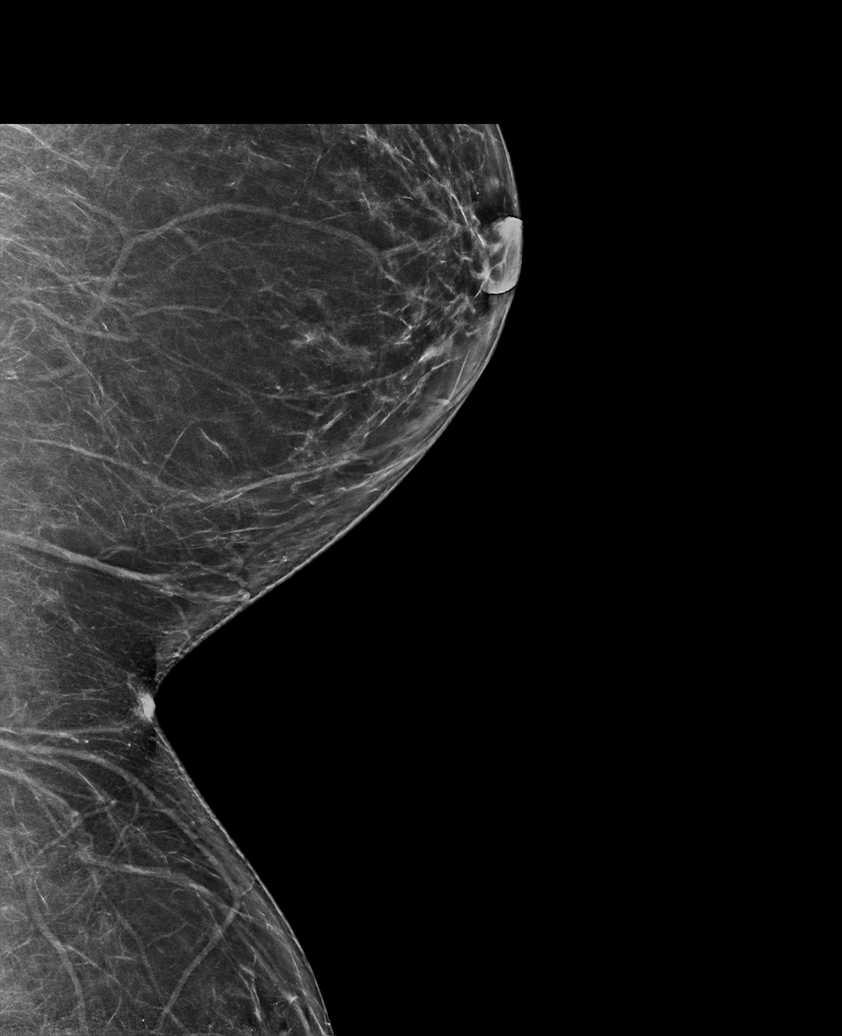

[L MLO synth-2D]
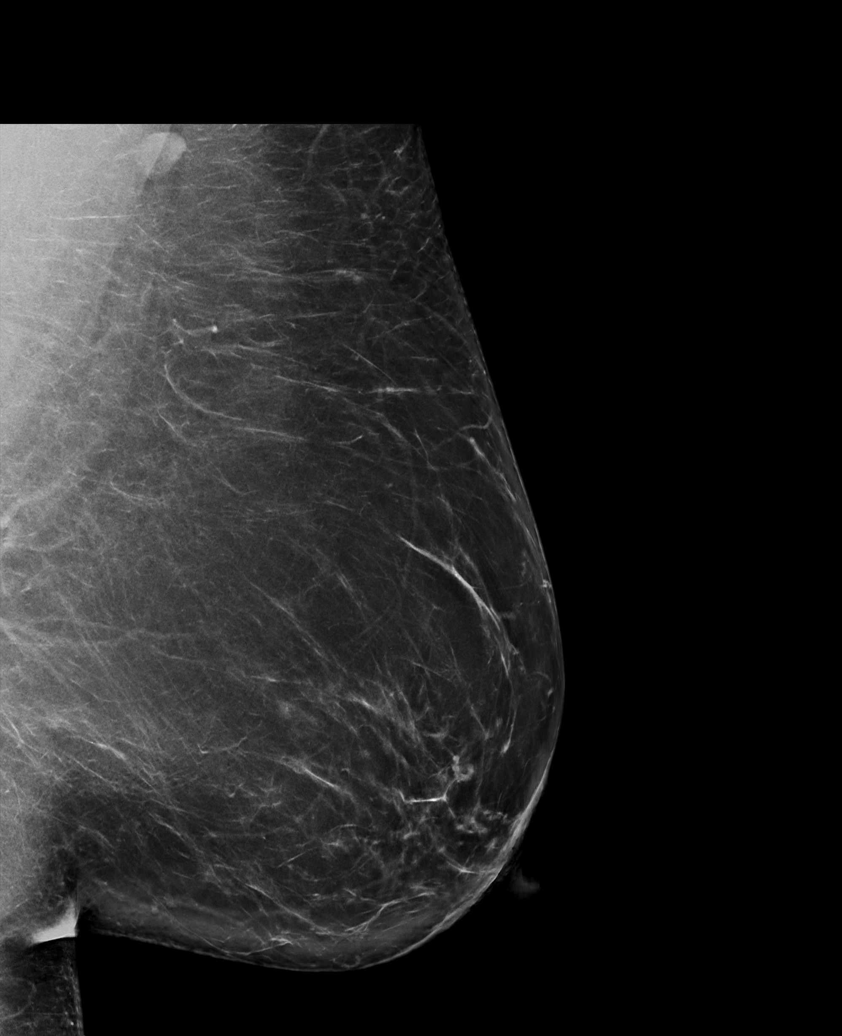

[L CC synth-2D]
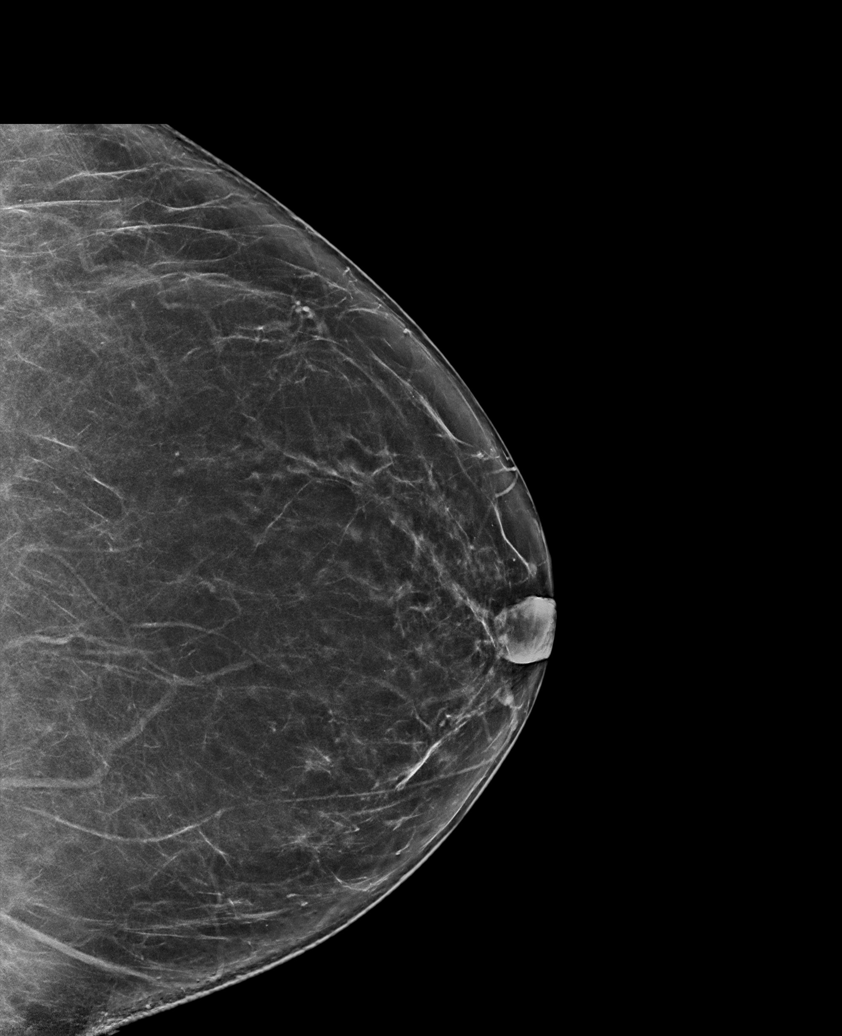

[R MLO synth-2D (2 of 2)]
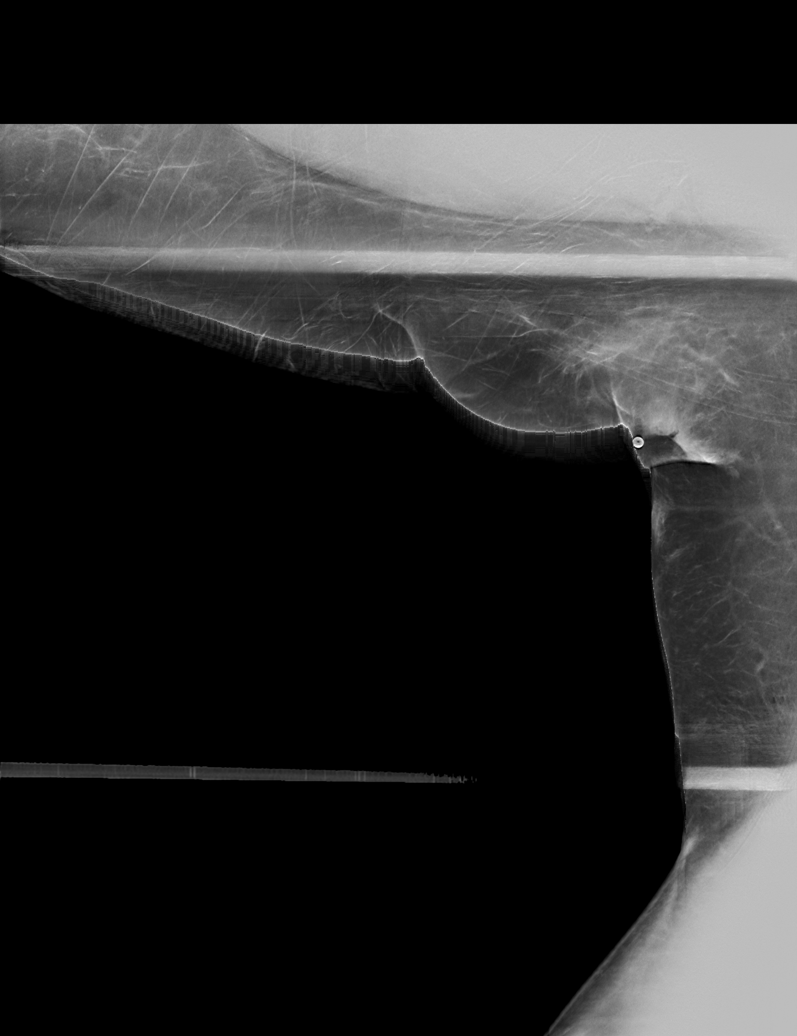

[6 of 36 positions shown; findings below may reference images not displayed]

ACR Breast Density Category b: There are scattered areas of
fibroglandular density.
FINDINGS: There are no suspicious mammographic findings in either breast. The
parenchymal pattern is stable. Radiopaque BB was placed at the site
of the patient's palpable lump in the right axilla. Associated skin
thickening and irregular asymmetry are seen deep to the radiopaque
BB.

On physical exam, I palpate a firm lump in the large axilla spanning
2-3 cm. There is no overlying skin redness or warmth.

Targeted ultrasound is performed, showing an irregular hypoechoic
mass with internal hyperechoic circumscribed nidus. There is marked
associated vascularity. Overall this area measures 2.4 x 2.3 x
cm.
IMPRESSION: 1. Probably benign inflammatory reaction in the right axilla,
possibly associated with a ruptured inclusion cyst, with or without
superinfection. Recommendation is for the patient began a course of
antibiotics and return in 7-10 days for reimaging to ensure
improvement/resolution.
2. Otherwise, no mammographic evidence of malignancy in either
breast.

RECOMMENDATION:
1. A prescription for a 10 day course of doxycycline was written for
the patient.
2. Return in 7-10 days for repeat ultrasound evaluation of the right
axilla.

I have discussed the findings and recommendations with the patient.
If applicable, a reminder letter will be sent to the patient
regarding the next appointment.

BI-RADS CATEGORY  3: Probably benign.

## 2023-06-05 IMAGING — US US AXILLARY RIGHT
1 series · 5 of 5 positions shown · non-contrast
Comparison: Previous exam(s).

CLINICAL DATA: 45-year-old female presents for re-evaluation of a
right axillary abscess which is been previously aspirated twice. The
patient has also completed 2 courses of antibiotics. Today, she
reports complete resolution of her previous symptoms including
painful lump and and redness in the area. She states the area opened
up and began draining on its own after 1 of the procedures, and now
has completely healed over.

EXAM:
ULTRASOUND OF THE RIGHT AXILLA

[Series 1: us axillary right · 0.04mm/px · 5 of 5 slices shown]
[im 1/5]
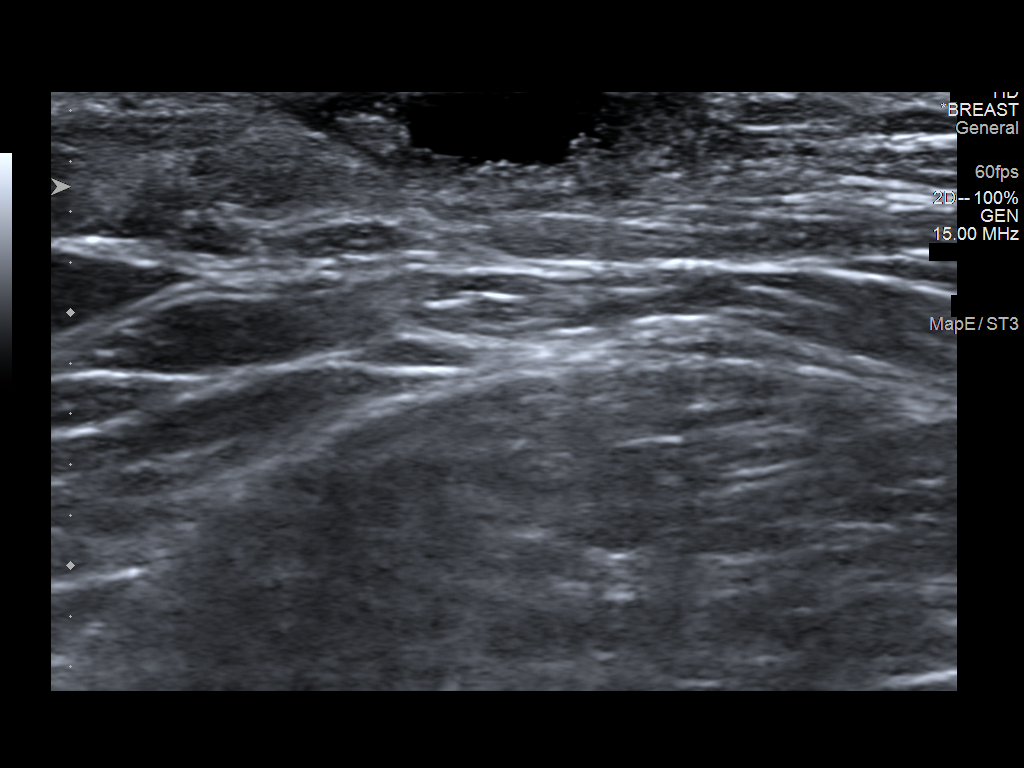
[im 2/5]
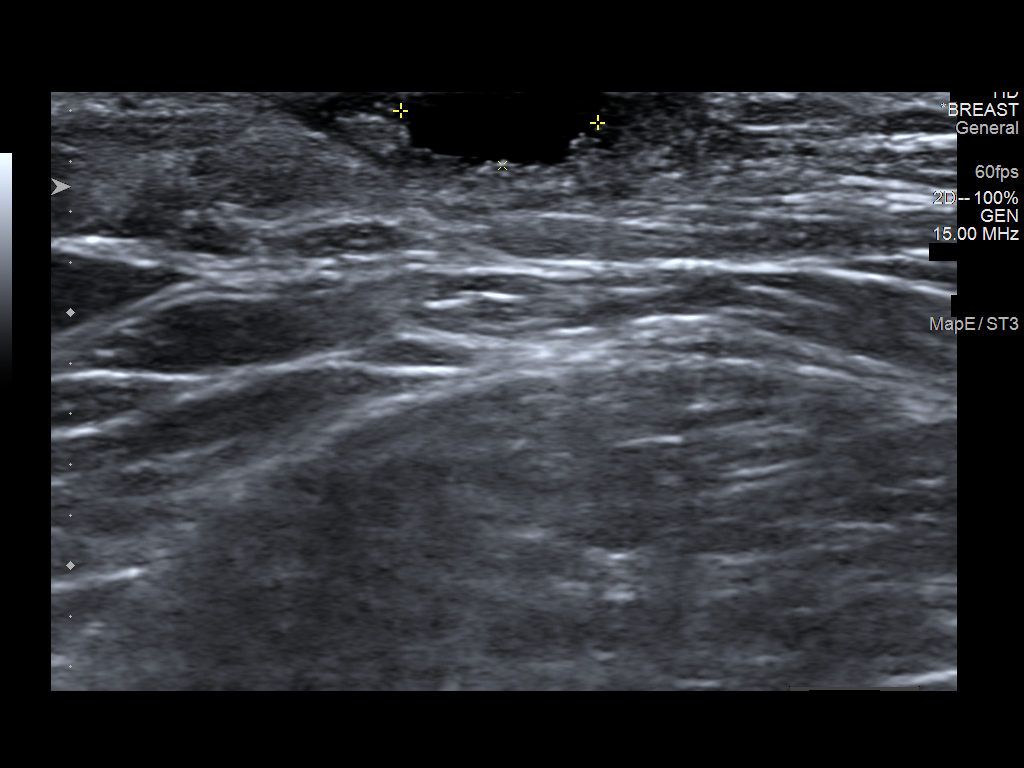
[im 3/5]
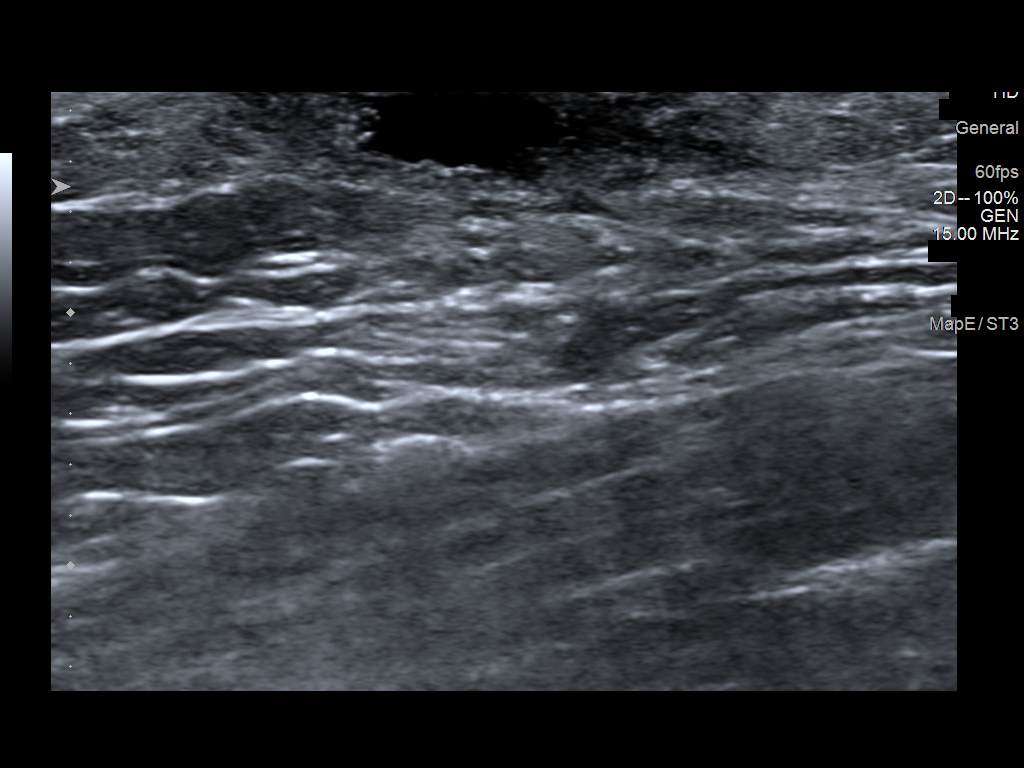
[im 4/5]
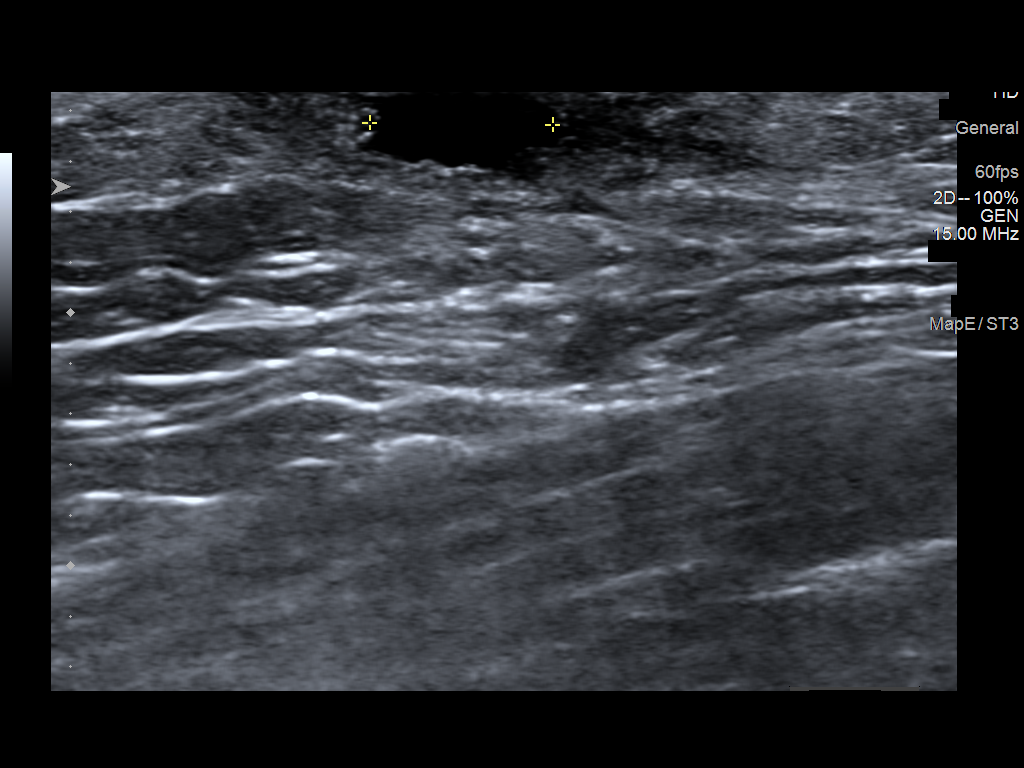
[im 5/5]
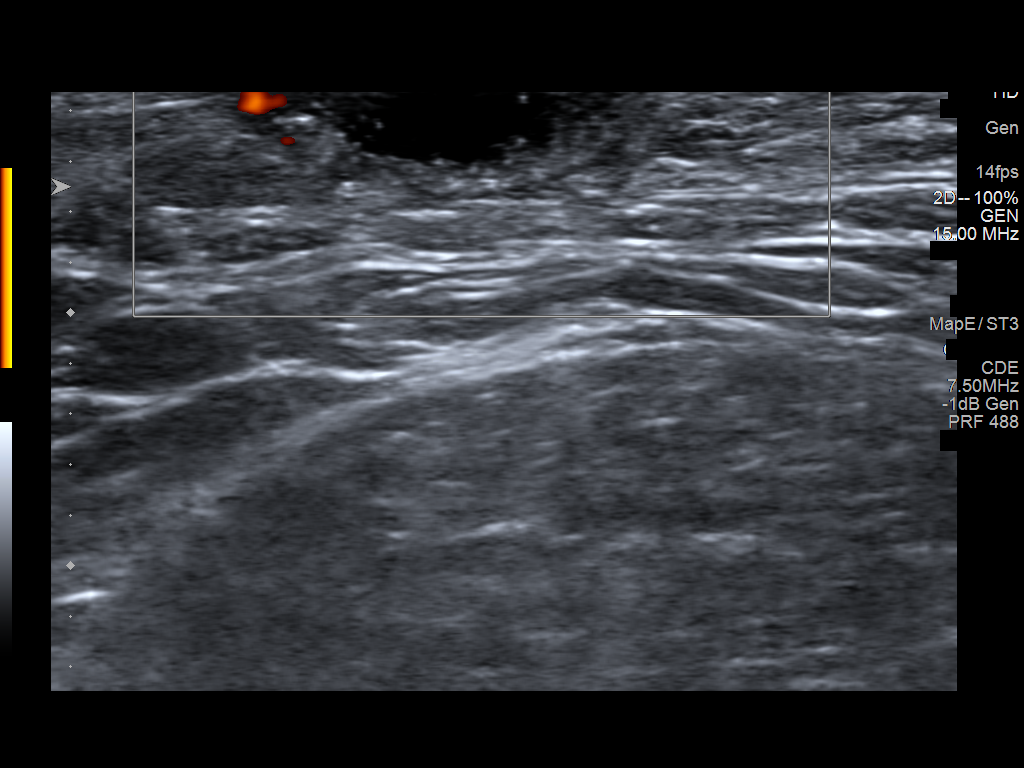

[5 of 5 positions shown; findings below may reference images not displayed]

FINDINGS: On physical exam, there is a small scar overlying the skin at the
right axilla. I palpate no masses. There is no overlying redness or
associated pain.

Ultrasound is performed, showing a small residual completely
anechoic fluid collection within the right axilla at the site of the
patient's prior abscess. Today it measures 8 x 7 x 3 mm. There is no
significant associated vascularity.
IMPRESSION: Small residual anechoic fluid collection at the site of the
patient's prior symptoms. She reports complete clinical resolution
of her symptoms.

RECOMMENDATION:
Clinical follow-up for the patient's right axillary symptoms. The
patient was instructed to return if her symptoms worsen or returning
again. Otherwise, she may resume routine annual screening, due in
September 2021.

I have discussed the findings and recommendations with the patient.
If applicable, a reminder letter will be sent to the patient
regarding the next appointment.

BI-RADS CATEGORY  2: Benign.

## 2023-06-24 ENCOUNTER — Other Ambulatory Visit: Payer: Self-pay | Admitting: Family Medicine

## 2023-06-24 DIAGNOSIS — Z0000 Encounter for general adult medical examination without abnormal findings: Secondary | ICD-10-CM

## 2023-07-23 ENCOUNTER — Encounter: Admitting: Nurse Practitioner

## 2023-07-26 ENCOUNTER — Encounter: Admitting: Family Medicine

## 2023-07-31 ENCOUNTER — Encounter: Payer: Self-pay | Admitting: Nurse Practitioner

## 2023-07-31 ENCOUNTER — Ambulatory Visit (INDEPENDENT_AMBULATORY_CARE_PROVIDER_SITE_OTHER): Admitting: Nurse Practitioner

## 2023-07-31 ENCOUNTER — Telehealth: Payer: Self-pay | Admitting: Nurse Practitioner

## 2023-07-31 ENCOUNTER — Ambulatory Visit
Admission: RE | Admit: 2023-07-31 | Discharge: 2023-07-31 | Disposition: A | Discharge status: Home / Self Care | Source: Ambulatory Visit | Attending: Family Medicine | Admitting: Family Medicine

## 2023-07-31 VITALS — BP 136/88 | HR 76 | Temp 97.9°F | Ht 63.0 in | Wt 268.8 lb

## 2023-07-31 DIAGNOSIS — D500 Iron deficiency anemia secondary to blood loss (chronic): Secondary | ICD-10-CM

## 2023-07-31 DIAGNOSIS — Z0000 Encounter for general adult medical examination without abnormal findings: Secondary | ICD-10-CM

## 2023-07-31 DIAGNOSIS — Z13220 Encounter for screening for lipoid disorders: Secondary | ICD-10-CM

## 2023-07-31 DIAGNOSIS — Z136 Encounter for screening for cardiovascular disorders: Secondary | ICD-10-CM

## 2023-07-31 DIAGNOSIS — Z1231 Encounter for screening mammogram for malignant neoplasm of breast: Secondary | ICD-10-CM | POA: Diagnosis not present

## 2023-07-31 DIAGNOSIS — Z0001 Encounter for general adult medical examination with abnormal findings: Secondary | ICD-10-CM

## 2023-07-31 LAB — COMPREHENSIVE METABOLIC PANEL WITH GFR
ALT: 14 U/L (ref 0–35)
AST: 15 U/L (ref 0–37)
Albumin: 4.1 g/dL (ref 3.5–5.2)
Alkaline Phosphatase: 56 U/L (ref 39–117)
BUN: 9 mg/dL (ref 6–23)
CO2: 29 meq/L (ref 19–32)
Calcium: 9.1 mg/dL (ref 8.4–10.5)
Chloride: 103 meq/L (ref 96–112)
Creatinine, Ser: 0.71 mg/dL (ref 0.40–1.20)
GFR: 102.12 mL/min (ref >60.00)
Glucose, Bld: 91 mg/dL (ref 70–99)
Potassium: 3.5 meq/L (ref 3.5–5.1)
Sodium: 139 meq/L (ref 135–145)
Total Bilirubin: 0.5 mg/dL (ref 0.2–1.2)
Total Protein: 7.4 g/dL (ref 6.0–8.3)

## 2023-07-31 LAB — CBC WITH DIFFERENTIAL/PLATELET
Basophils Absolute: 0 10*3/uL (ref 0.0–0.1)
Basophils Relative: 0.4 % (ref 0.0–3.0)
Eosinophils Absolute: 0.1 10*3/uL (ref 0.0–0.7)
Eosinophils Relative: 1.2 % (ref 0.0–5.0)
HCT: 35.4 % — ABNORMAL LOW (ref 36.0–46.0)
Hemoglobin: 11.6 g/dL — ABNORMAL LOW (ref 12.0–15.0)
Lymphocytes Relative: 24.7 % (ref 12.0–46.0)
Lymphs Abs: 1.6 10*3/uL (ref 0.7–4.0)
MCHC: 32.8 g/dL (ref 30.0–36.0)
MCV: 73.4 fl — ABNORMAL LOW (ref 78.0–100.0)
Monocytes Absolute: 0.3 10*3/uL (ref 0.1–1.0)
Monocytes Relative: 4.4 % (ref 3.0–12.0)
Neutro Abs: 4.4 10*3/uL (ref 1.4–7.7)
Neutrophils Relative %: 69.3 % (ref 43.0–77.0)
Platelets: 335 10*3/uL (ref 150.0–400.0)
RBC: 4.82 Mil/uL (ref 3.87–5.11)
RDW: 15 % (ref 11.5–15.5)
WBC: 6.3 10*3/uL (ref 4.0–10.5)

## 2023-07-31 LAB — LIPID PANEL
Cholesterol: 175 mg/dL (ref 0–200)
HDL: 51 mg/dL (ref >39.00)
LDL Cholesterol: 102 mg/dL — ABNORMAL HIGH (ref 0–99)
NonHDL: 123.51
Total CHOL/HDL Ratio: 3
Triglycerides: 109 mg/dL (ref 0.0–149.0)
VLDL: 21.8 mg/dL (ref 0.0–40.0)

## 2023-07-31 LAB — TSH: TSH: 1.23 u[IU]/mL (ref 0.35–5.50)

## 2023-07-31 LAB — IRON,TIBC AND FERRITIN PANEL
%SAT: 14 % — ABNORMAL LOW (ref 16–45)
Ferritin: 23 ng/mL (ref 16–232)
Iron: 45 ug/dL (ref 40–190)
TIBC: 325 ug/dL (ref 250–450)

## 2023-07-31 NOTE — Progress Notes (Signed)
 Complete physical exam  Patient: Candice Arellano   DOB: 1975-07-06   48 y.o. Female  MRN: [id number] Visit Date: 07/31/2023  Subjective:    Chief Complaint  Patient presents with   Annual Exam   Candice Arellano is a 48 y.o. female who presents today for a complete physical exam. She reports consuming a general diet.  No exercise regimen  She generally feels well. She reports sleeping poorly. She does have additional problems to discuss today.  Vision:Yes Dental:Yes STD Screen:No  BP Readings from Last 3 Encounters:  07/31/23 136/88  12/06/22 (!) 144/69  08/08/22 (!) 137/90   Wt Readings from Last 3 Encounters:  07/31/23 268 lb 12.8 oz (121.9 kg)  12/06/22 250 lb (113.4 kg)  11/09/22 250 lb (113.4 kg)   Most recent fall risk assessment:    07/31/2023    8:52 AM  Fall Risk   Falls in the past year? 1  Number falls in past yr: 0  Injury with Fall? 0  Risk for fall due to : No Fall Risks  Follow up Falls evaluation completed   Depression screen:Yes - No Depression  Most recent depression screenings:    07/31/2023    9:18 AM 07/20/2022    2:11 PM  PHQ 2/9 Scores  PHQ - 2 Score 1 0  PHQ- 9 Score 4 3   HPI  Anemia Secondary to menorrhagia. S/p uterine ablation. Repeat cbc and iron panel  Past Medical History:  Diagnosis Date   Abnormal Pap smear of cervix 2013   colpo was negative per patient   Abnormal uterine bleeding (AUB)    Allergy    Pt stated had reaction to med after procedure in 08/2022. Pt to find out what med. Sweeling and hives   Anemia    Carpal tunnel syndrome on both sides    Chicken pox    as child   CIN II (cervical intraepithelial neoplasia II)    s/p leep 2018   GERD (gastroesophageal reflux disease)    Gestational hypertension    Headache    History of COVID-19 07/2020   03-2022   history of depression    Wears glasses    Wears partial dentures    upper   Past Surgical History:  Procedure Laterality Date   CESAREAN SECTION   2004   DILITATION & CURRETTAGE/HYSTROSCOPY WITH HYDROTHERMAL ABLATION  08/06/2022   Procedure: DILATATION & CURETTAGE/HYSTEROSCOPY WITH HYDROTHERMAL ABLATION;  Surgeon: Granville Layer, MD;  Location: [facility] [place];  Service: Gynecology;;   IUD REMOVAL  08/06/2022   Procedure: INTRAUTERINE DEVICE (IUD) REMOVAL;  Surgeon: Granville Layer, MD;  Location: [facility] [place];  Service: Gynecology;;   LEEP  2018   TENDON REPAIR Left 1992   left thumb   TUBAL LIGATION  2004   VAGINAL DELIVERY     1999   Social History   Socioeconomic History   Marital status: Divorced    Spouse name: Not on file   Number of children: 2   Years of education: Not on file   Highest education level: Not on file  Occupational History   Occupation: unemployed  Tobacco Use   Smoking status: Never    Passive exposure: Never   Smokeless tobacco: Never  Vaping Use   Vaping status: Never Used  Substance and Sexual Activity   Alcohol use: Yes    Alcohol/week: 1.0 standard drink of alcohol    Types: 1 Glasses of wine per week  Comment: occasionally   Drug use: No   Sexual activity: Yes    Partners: Male    Birth control/protection: Surgical, I.U.D.    Comment: tubal ligation, IUD  Other Topics Concern   Not on file  Social History Narrative   Lives with parents and daughter and son. In a relationship for past year and a half.  Divorced.  Unemployed.  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   Social Drivers of Health   Financial Resource Strain: Low Risk  (07/31/2023)   Overall Financial Resource Strain (CARDIA)    Difficulty of Paying Living Expenses: Not hard at all  Food Insecurity: Not on file  Transportation Needs: Not on file  Physical Activity: Sufficiently Active (07/31/2023)   Exercise Vital Sign    Days of Exercise per Week: 6 days    Minutes of Exercise per Session: 30 min   Stress: Stress Concern Present (07/31/2023)   [employer] of Occupational Health - Occupational Stress Questionnaire    Feeling of Stress : To some extent  Social Connections: Moderately Isolated (07/31/2023)   Social Connection and Isolation Panel [NHANES]    Frequency of Communication with Friends and Family: Three times a week    Frequency of Social Gatherings with Friends and Family: Three times a week    Attends Religious Services: More than 4 times per year    Active Member of Clubs or Organizations: No    Attends Banker Meetings: Never    Marital Status: Divorced  Catering manager Violence: Not At Risk (07/31/2023)   Humiliation, Afraid, Rape, and Kick questionnaire    Fear of Current or Ex-Partner: No    Emotionally Abused: No    Physically Abused: No    Sexually Abused: No   Family Status  Relation Name Status   Mother  Alive   Father  Alive   Brother  Alive   Mat Aunt  (Not Specified)   MGM  Deceased   MGF  Deceased   PGM  Deceased   PGF  Deceased   Daughter  Alive   Neg Hx  (Not Specified)  No partnership data on file   Family History  Problem Relation Age of Onset   Heart disease Mother    Hypertension Mother    Arthritis Mother        OA   Heart disease Father    Hypertension Father    Asthma Father    Diabetes Father    Stomach cancer Maternal Aunt    Arthritis Maternal Grandmother    Diabetes Maternal Grandmother    Hypertension Maternal Grandmother    Cancer Maternal Grandmother 42       pancreatic   Kidney disease Maternal Grandfather        unknown cause   Heart disease Paternal Grandmother 65       CABG, MI   Arthritis Paternal Grandmother    Heart attack Paternal Grandfather 80       MI cause of death   Asthma Daughter    Stroke Neg Hx    Depression Neg Hx    Alcohol abuse Neg Hx    Drug abuse Neg Hx    Breast cancer Neg Hx    Colon cancer Neg Hx    Colon polyps Neg Hx    Esophageal cancer Neg Hx    Rectal cancer  Neg Hx  Allergies  Allergen Reactions   Latex Itching   Shellfish Allergy Swelling   Prednisone  Rash    Patient Care Team: Toniette Devera, Connye Delaine, NP as PCP - General (Internal Medicine)   Medications: Outpatient Medications Prior to Visit  Medication Sig   ASHWAGANDHA PO Take by mouth as needed. gummy   fluconazole  (DIFLUCAN ) 150 MG tablet Take by mouth as needed.   ibuprofen  (ADVIL ) 400 MG tablet Take 400 mg by mouth every 6 (six) hours as needed. Takes 2 of 200 mg   Lactobacillus (PROBIOTIC ACIDOPHILUS PO) Take 1 tablet by mouth daily.   Multiple Vitamin (MULTIVITAMIN) tablet Take 1 tablet by mouth daily.   nystatin  cream (MYCOSTATIN ) Apply 1 Application topically 2 (two) times daily. (Patient taking differently: Apply 1 Application topically as needed.)   OVER THE COUNTER MEDICATION Elderberry gummy with vit d and vit c 2 per day   oxyCODONE  (ROXICODONE ) 5 MG immediate release tablet Take 1 tablet (5 mg total) by mouth every 4 (four) hours as needed for severe pain.   [DISCONTINUED] Azelastine -Fluticasone  (DYMISTA ) 137-50 MCG/ACT SUSP Place 1 spray into the nose in the morning and at bedtime. (Patient not taking: Reported on 07/31/2023)   [DISCONTINUED] norethindrone  (AYGESTIN ) 5 MG tablet Take 1 tablet (5 mg total) by mouth daily. (Patient not taking: Reported on 11/09/2022)   [DISCONTINUED] predniSONE  (STERAPRED UNI-PAK 21 TAB) 10 MG (21) TBPK tablet Take by mouth daily. As directed (Patient not taking: Reported on 07/31/2023)   No facility-administered medications prior to visit.    Review of Systems  Constitutional:  Negative for activity change, appetite change and unexpected weight change.  Respiratory: Negative.    Cardiovascular: Negative.   Gastrointestinal: Negative.   Endocrine: Negative for cold intolerance and heat intolerance.  Genitourinary: Negative.   Musculoskeletal: Negative.   Skin: Negative.   Neurological: Negative.   Hematological: Negative.    Psychiatric/Behavioral:  Negative for behavioral problems, decreased concentration, dysphoric mood, hallucinations, self-injury, sleep disturbance and suicidal ideas. The patient is not nervous/anxious.        Objective:  BP 136/88   Pulse 76   Temp 97.9 F (36.6 C) (Temporal)   Ht 5\' 3"  (1.6 m)   Wt 268 lb 12.8 oz (121.9 kg)   SpO2 96%   BMI 47.62 kg/m     Physical Exam Vitals and nursing note reviewed.  Constitutional:      General: She is not in acute distress. HENT:     Right Ear: Tympanic membrane, ear canal and external ear normal.     Left Ear: Tympanic membrane, ear canal and external ear normal.     Nose: Nose normal.  Eyes:     Extraocular Movements: Extraocular movements intact.     Conjunctiva/sclera: Conjunctivae normal.     Pupils: Pupils are equal, round, and reactive to light.  Neck:     Thyroid : No thyroid  mass, thyromegaly or thyroid  tenderness.  Cardiovascular:     Rate and Rhythm: Normal rate and regular rhythm.     Pulses: Normal pulses.     Heart sounds: Normal heart sounds.  Pulmonary:     Effort: Pulmonary effort is normal.     Breath sounds: Normal breath sounds.  Abdominal:     General: Bowel sounds are normal.     Palpations: Abdomen is soft.  Musculoskeletal:        General: Normal range of motion.     Cervical back: Normal range of motion and neck supple.     Right  lower leg: No edema.     Left lower leg: No edema.  Lymphadenopathy:     Cervical: No cervical adenopathy.  Skin:    General: Skin is warm and dry.  Neurological:     Mental Status: She is alert and oriented to person, place, and time.     Cranial Nerves: No cranial nerve deficit.  Psychiatric:        Mood and Affect: Mood normal.        Behavior: Behavior normal.        Thought Content: Thought content normal.      No results found for any visits on 07/31/23.    Assessment & Plan:    Routine Health Maintenance and Physical Exam  Immunization History   Administered Date(s) Administered   Influenza Split 01/12/2011, 02/01/2016   Influenza,inj,Quad PF,6+ Mos 05/03/2014, 03/02/2015   PFIZER(Purple Top)SARS-COV-2 Vaccination 08/24/2019, 09/14/2019   Td (Adult),unspecified 05/09/1994   Tdap 01/12/2011, 07/20/2022   Health Maintenance  Topic Date Due   COVID-19 Vaccine (3 - 2024-25 season) 12/02/2022   INFLUENZA VACCINE  11/01/2023   Cervical Cancer Screening (HPV/Pap Cotest)  01/04/2027   DTaP/Tdap/Td (4 - Td or Tdap) 07/19/2032   Colonoscopy  12/05/2032   Hepatitis C Screening  Completed   HIV Screening  Completed   HPV VACCINES  Aged Out   Meningococcal B Vaccine  Aged Out   Discussed health benefits of physical activity, and encouraged her to engage in regular exercise appropriate for her age and condition.  Problem List Items Addressed This Visit     Anemia   Secondary to menorrhagia. S/p uterine ablation. Repeat cbc and iron panel      Relevant Orders   CBC with Differential/Platelet   Iron, TIBC and Ferritin Panel   Other Visit Diagnoses       Encounter for preventative adult health care exam with abnormal findings    -  Primary   Relevant Orders   Comprehensive metabolic panel with GFR   TSH   Lipid panel     Encounter for lipid screening for cardiovascular disease       Relevant Orders   Lipid panel      Return in about 1 week (around 08/07/2023) for left leg pain, cough, fatigue.     Kathrene Parents, NP

## 2023-07-31 NOTE — Patient Instructions (Signed)
 Go to lab Maintain Heart healthy diet and daily exercise.

## 2023-07-31 NOTE — Telephone Encounter (Signed)
 error

## 2023-07-31 NOTE — Assessment & Plan Note (Signed)
 Secondary to menorrhagia. S/p uterine ablation. Repeat cbc and iron panel

## 2023-08-02 ENCOUNTER — Other Ambulatory Visit: Payer: Self-pay | Admitting: Family Medicine

## 2023-08-02 DIAGNOSIS — R928 Other abnormal and inconclusive findings on diagnostic imaging of breast: Secondary | ICD-10-CM

## 2023-08-05 ENCOUNTER — Ambulatory Visit (INDEPENDENT_AMBULATORY_CARE_PROVIDER_SITE_OTHER): Admitting: Family Medicine

## 2023-08-05 ENCOUNTER — Encounter: Payer: Self-pay | Admitting: Nurse Practitioner

## 2023-08-05 ENCOUNTER — Encounter: Payer: Self-pay | Admitting: Family Medicine

## 2023-08-05 ENCOUNTER — Other Ambulatory Visit (HOSPITAL_COMMUNITY)
Admission: RE | Admit: 2023-08-05 | Discharge: 2023-08-05 | Disposition: A | Discharge status: Home / Self Care | Source: Ambulatory Visit | Attending: Family Medicine | Admitting: Family Medicine

## 2023-08-05 VITALS — BP 143/86 | HR 69 | Ht 63.0 in | Wt 269.0 lb

## 2023-08-05 DIAGNOSIS — R61 Generalized hyperhidrosis: Secondary | ICD-10-CM

## 2023-08-05 DIAGNOSIS — Z113 Encounter for screening for infections with a predominantly sexual mode of transmission: Secondary | ICD-10-CM

## 2023-08-05 DIAGNOSIS — Z1339 Encounter for screening examination for other mental health and behavioral disorders: Secondary | ICD-10-CM

## 2023-08-05 DIAGNOSIS — Z01419 Encounter for gynecological examination (general) (routine) without abnormal findings: Secondary | ICD-10-CM

## 2023-08-05 NOTE — Progress Notes (Signed)
 Patient presents for Annual.  LMP: May have spotting here and there. Last pap: Date: 01/03/2022 WNL   Ablation Mammogram:  07/31/2023  has to have F/U on 08/20/23 STD Screening: Accepts  CC:  Night sweats, mood swings.Anxiety mostly stress related.

## 2023-08-05 NOTE — Progress Notes (Signed)
 Subjective:     Candice Arellano is a 48 y.o. female and is here for a comprehensive physical exam. The patient reports problems - hot flashes. Recent normal TSH. Hot flashes and night sweats. Last 1 year. LMP was 08/06/2022. Just has spotting. Has h/o CIN3 but recent paps are all WNL with neg HPV.   The following portions of the patient's history were reviewed and updated as appropriate: allergies, current medications, past family history, past medical history, past social history, past surgical history, and problem list.  Review of Systems Pertinent items noted in HPI and remainder of comprehensive ROS otherwise negative.   Objective:  Chaperone present for exam   BP (!) 143/86   Pulse 69   Ht 5\' 3"  (1.6 m)   Wt 269 lb (122 kg)   BMI 47.65 kg/m  General appearance: alert, cooperative, and appears stated age Head: Normocephalic, without obvious abnormality, atraumatic Neck: no adenopathy, supple, symmetrical, trachea midline, and thyroid  not enlarged, symmetric, no tenderness/mass/nodules Lungs: clear to auscultation bilaterally Breasts: normal appearance, no masses or tenderness Heart: regular rate and rhythm, S1, S2 normal, no murmur, click, rub or gallop Abdomen: soft, non-tender; bowel sounds normal; no masses,  no organomegaly Pelvic: cervix normal in appearance, external genitalia normal, no adnexal masses or tenderness, no cervical motion tenderness, uterus normal size, shape, and consistency, and vagina normal without discharge Extremities: extremities normal, atraumatic, no cyanosis or edema Pulses: 2+ and symmetric Skin: Skin color, texture, turgor normal. No rashes or lesions Lymph nodes: Cervical, supraclavicular, and axillary nodes normal. Neurologic: Grossly normal    Assessment:    Healthy female exam.      Plan:  Encounter for gynecological examination without abnormal finding  Screen for STD (sexually transmitted disease) - Plan: RPR+HBsAg+HCVAb+...,  Cervicovaginal ancillary only( [place])  Night sweats - Plan: Follicle stimulating hormone    See After Visit Summary for Counseling Recommendations

## 2023-08-06 LAB — RPR+HBSAG+HCVAB+...
HIV Screen 4th Generation wRfx: NONREACTIVE
Hep C Virus Ab: NONREACTIVE
Hepatitis B Surface Ag: NEGATIVE
RPR Ser Ql: NONREACTIVE

## 2023-08-06 LAB — FOLLICLE STIMULATING HORMONE: FSH: 7.7 m[IU]/mL

## 2023-08-07 ENCOUNTER — Encounter: Payer: Self-pay | Admitting: Family Medicine

## 2023-08-07 LAB — CERVICOVAGINAL ANCILLARY ONLY
Chlamydia: NEGATIVE
Comment: NEGATIVE
Comment: NEGATIVE
Comment: NORMAL
Neisseria Gonorrhea: NEGATIVE
Trichomonas: NEGATIVE

## 2023-08-20 ENCOUNTER — Ambulatory Visit: Admission: RE | Admit: 2023-08-20 | Source: Ambulatory Visit

## 2023-08-20 ENCOUNTER — Other Ambulatory Visit: Payer: Self-pay | Admitting: Family Medicine

## 2023-08-20 ENCOUNTER — Ambulatory Visit
Admission: RE | Admit: 2023-08-20 | Discharge: 2023-08-20 | Disposition: A | Discharge status: Home / Self Care | Source: Ambulatory Visit | Attending: Family Medicine | Admitting: Family Medicine

## 2023-08-20 DIAGNOSIS — R928 Other abnormal and inconclusive findings on diagnostic imaging of breast: Secondary | ICD-10-CM

## 2023-08-20 DIAGNOSIS — N6311 Unspecified lump in the right breast, upper outer quadrant: Secondary | ICD-10-CM | POA: Diagnosis not present

## 2023-08-20 DIAGNOSIS — N6310 Unspecified lump in the right breast, unspecified quadrant: Secondary | ICD-10-CM

## 2024-02-21 ENCOUNTER — Encounter

## 2024-03-06 ENCOUNTER — Encounter

## 2024-03-06 ENCOUNTER — Ambulatory Visit
Admission: RE | Admit: 2024-03-06 | Discharge: 2024-03-06 | Disposition: A | Discharge status: Home / Self Care | Source: Ambulatory Visit | Attending: Family Medicine | Admitting: Family Medicine

## 2024-03-06 ENCOUNTER — Encounter: Payer: Self-pay | Admitting: Nurse Practitioner

## 2024-03-06 DIAGNOSIS — N6310 Unspecified lump in the right breast, unspecified quadrant: Secondary | ICD-10-CM

## 2024-03-06 DIAGNOSIS — R928 Other abnormal and inconclusive findings on diagnostic imaging of breast: Secondary | ICD-10-CM

## 2024-03-07 ENCOUNTER — Ambulatory Visit: Payer: Self-pay | Admitting: Family Medicine
